# Patient Record
Sex: Female | Born: 1980 | Race: White | Hispanic: No | Marital: Married | Smoking: Never smoker
Health system: Southern US, Community
[De-identification: ages and names within clinical notes are randomized; demographics above are authoritative.]

## PROBLEM LIST (undated history)

## (undated) DIAGNOSIS — O139 Gestational [pregnancy-induced] hypertension without significant proteinuria, unspecified trimester: Secondary | ICD-10-CM

## (undated) DIAGNOSIS — Z789 Other specified health status: Secondary | ICD-10-CM

## (undated) HISTORY — DX: Gestational (pregnancy-induced) hypertension without significant proteinuria, unspecified trimester: O13.9

## (undated) HISTORY — DX: Other specified health status: Z78.9

## (undated) HISTORY — PX: WISDOM TOOTH EXTRACTION: SHX21

---

## 2001-01-07 ENCOUNTER — Other Ambulatory Visit: Admission: RE | Admit: 2001-01-07 | Discharge: 2001-01-07 | Payer: Self-pay | Admitting: Family Medicine

## 2003-10-22 ENCOUNTER — Encounter: Admission: RE | Admit: 2003-10-22 | Discharge: 2003-10-22 | Payer: Self-pay | Admitting: Family Medicine

## 2003-10-22 IMAGING — CT CT ABDOMEN W/O CM
1 series · 15 of 32 positions shown, 19 images · IV contrast (agent unspecified)
Comparison: none

CLINICAL DATA: Hematuria and renal colic.  Suspect renal calculi.
TECHNIQUE: Multidetector helical CT of the abdomen and pelvis was performed following the renal stone protocol.  No oral or intravenous contrast was administered.  There are no prior studies for comparison. 
 CT ABDOMEN W/O CONTRAST: 
 There is no evidence of renal calculi or hydronephrosis.  Both kidneys are normal in contour.  There is no evidence of perinephric fluid or inflammatory change.  The visualized portions of the other abdominal parenchymal organs are unremarkable on this non contrast study.  No masses are identified and there is no evidence of inflammatory process or abnormal fluid collections.

[Series 2: renal stone · axial · 0.57mm/px · z∈[-405,-80]mm · 15 of 72 slices shown, 19 images]
[im 5/72  soft-tissue]
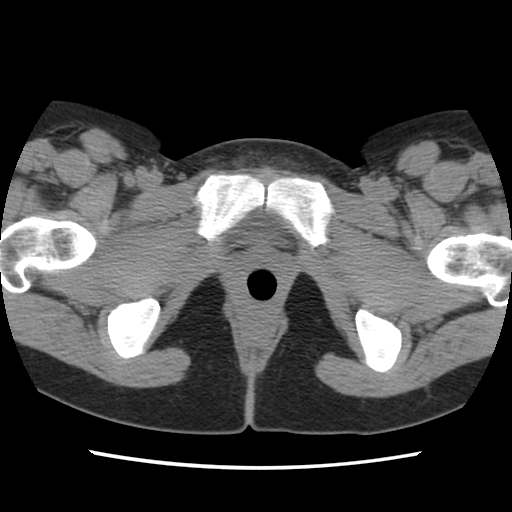
[im 5/72  bone]
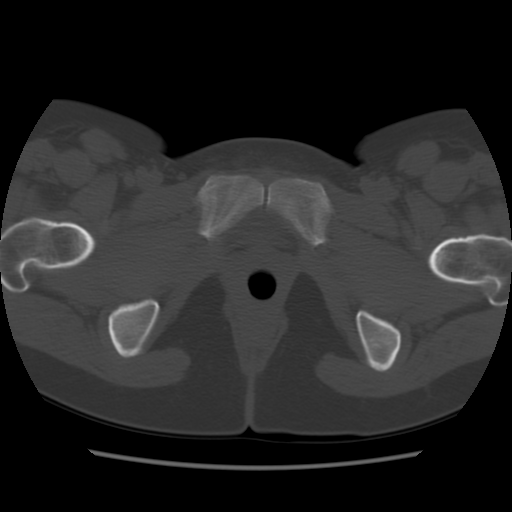
[im 10/72  soft-tissue]
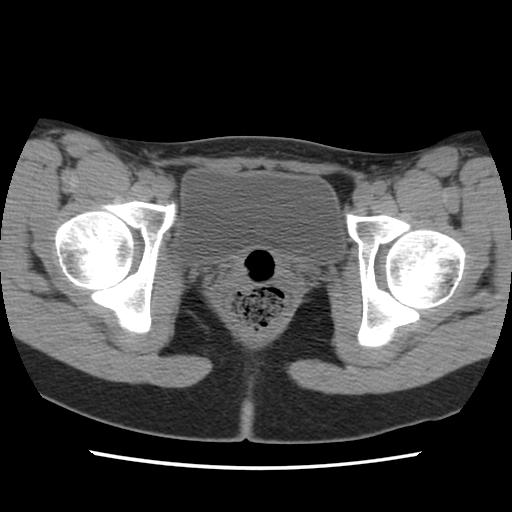
[im 14/72  soft-tissue]
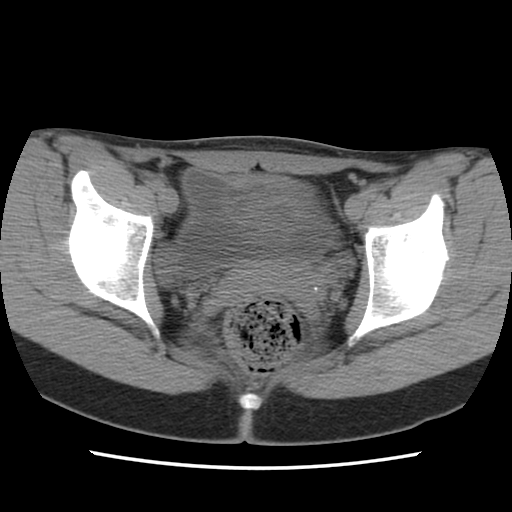
[im 21/72  soft-tissue]
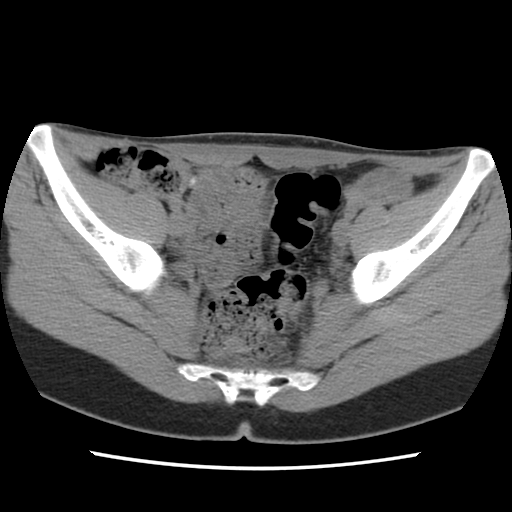
[im 26/72  soft-tissue]
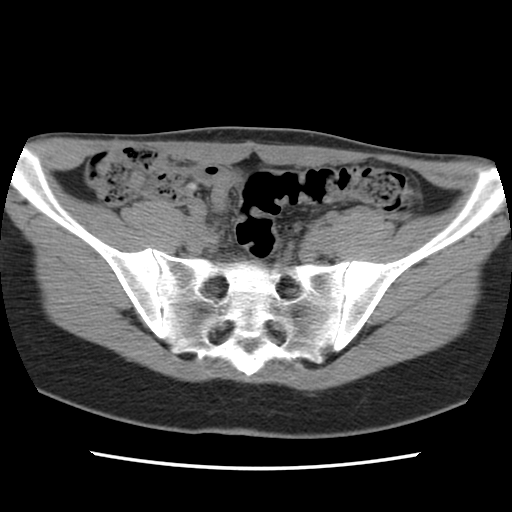
[im 30/72  soft-tissue]
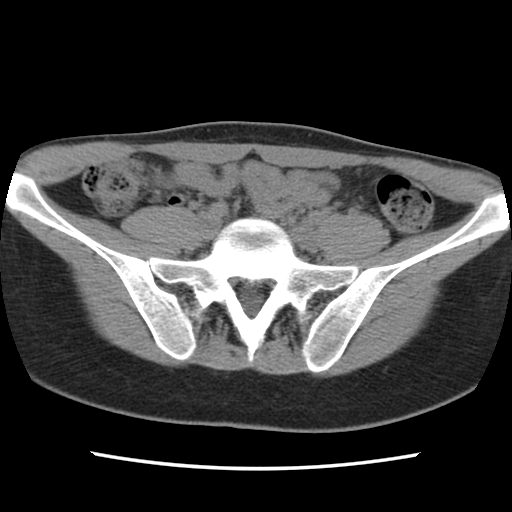
[im 37/72  soft-tissue]
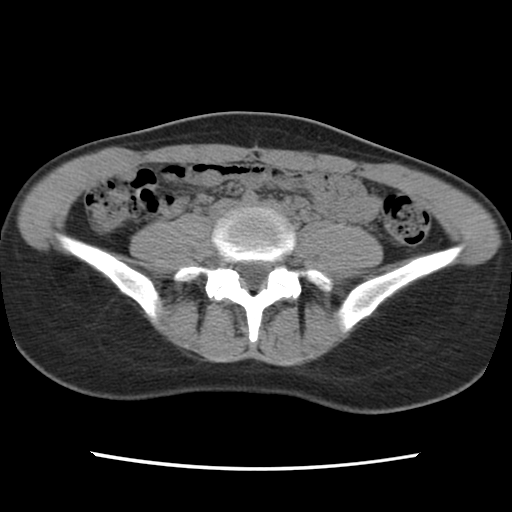
[im 42/72  soft-tissue]
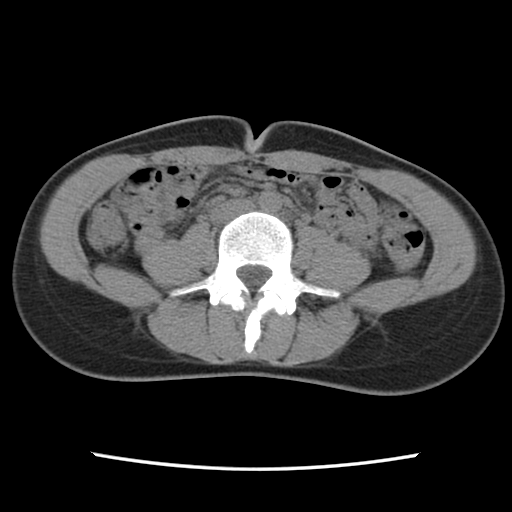
[im 46/72  soft-tissue]
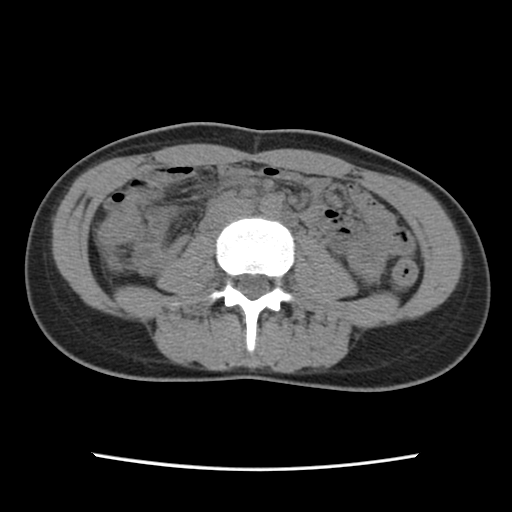
[im 46/72  bone]
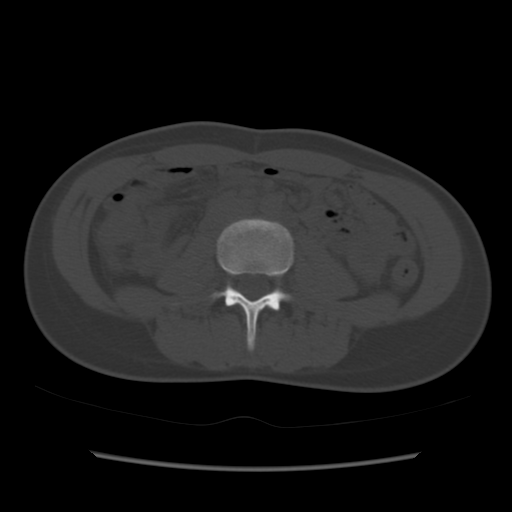
[im 51/72  soft-tissue]
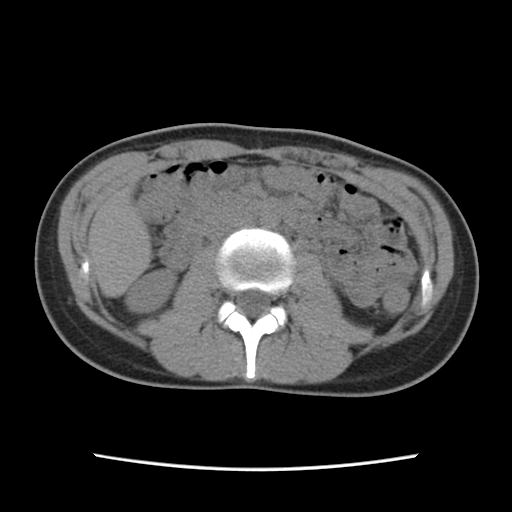
[im 58/72  soft-tissue]
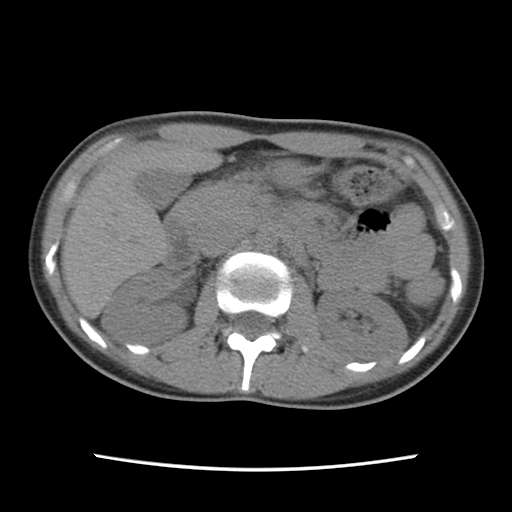
[im 62/72  soft-tissue]
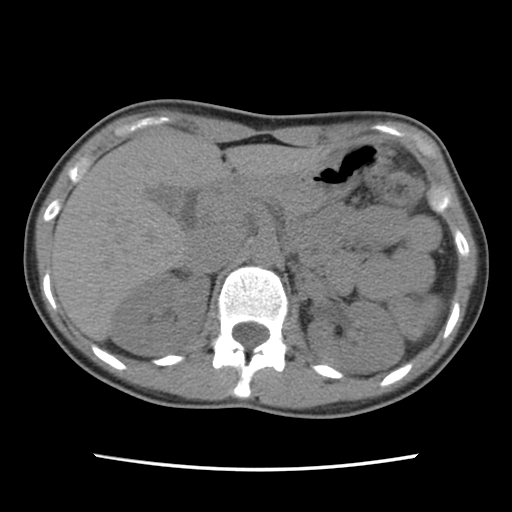
[im 62/72  lung]
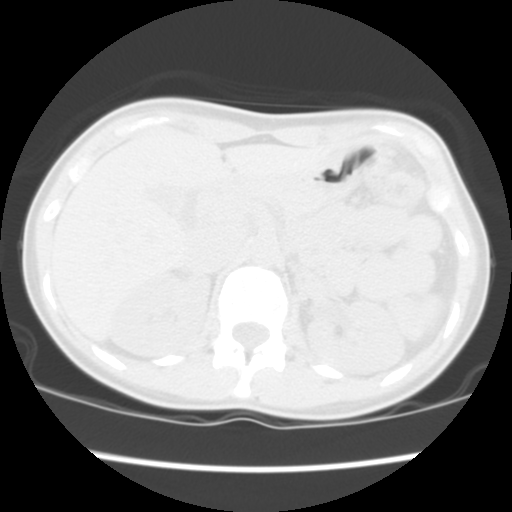
[im 65/72  lung]
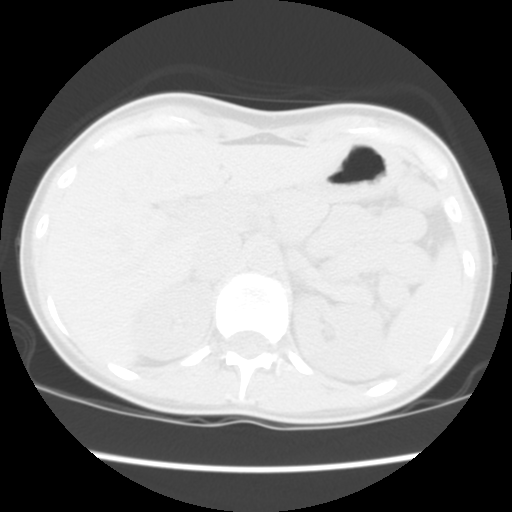
[im 67/72  soft-tissue]
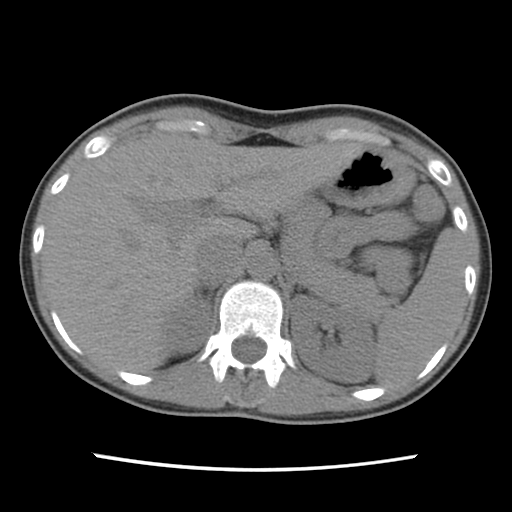
[im 67/72  lung]
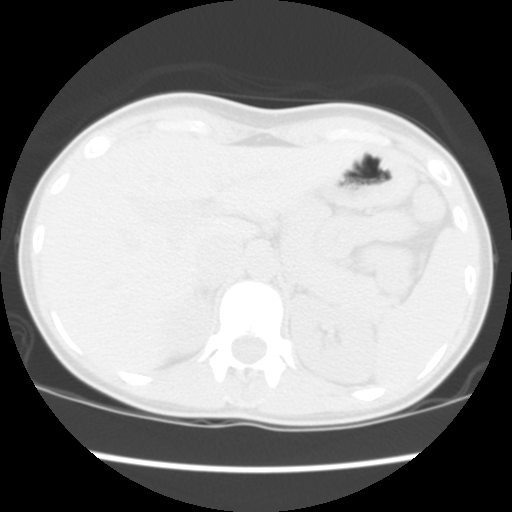
[im 69/72  lung]
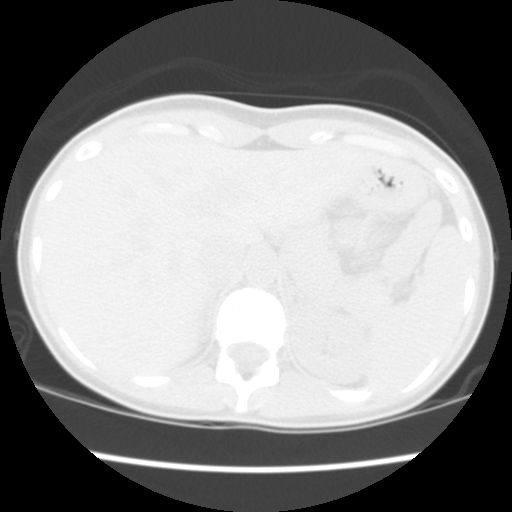

[15 of 32 positions shown; findings below may reference images not displayed]

IMPRESSION: Negative non contrast abdomen CT.  No evidence of renal calculi or hydronephrosis.  
 CT PELVIS W/O CONTRAST: 
 There is no evidence of ureteral calculi or dilatation.  No calculi are seen within the urinary bladder.  A left pelvic phlebolith is noted.  
 No abnormal soft tissue masses are identified.  There is no evidence of inflammatory process or abnormal fluid collections.  The unopacified bowel loops are unremarkable in appearance.
IMPRESSION: No evidence of ureteral calculi or other acute pelvic process.

## 2007-05-30 ENCOUNTER — Other Ambulatory Visit: Admission: RE | Admit: 2007-05-30 | Discharge: 2007-05-30 | Payer: Self-pay | Admitting: Family Medicine

## 2007-06-09 ENCOUNTER — Encounter: Admission: RE | Admit: 2007-06-09 | Discharge: 2007-06-09 | Payer: Self-pay | Admitting: Family Medicine

## 2007-06-09 IMAGING — US US BREAST R
1 series · 4 of 4 positions shown · non-contrast
Comparison: none

RIGHT BREAST ULTRASOUND
Technologist: MENSEL, Medical

RIGHT BREAST ULTRASOUND:
CLINICAL DATA: Doctor feels palpable mass, 12 o'clock position.

[Series 1: us breast right · 4 of 4 slices shown]
[im 1/4]
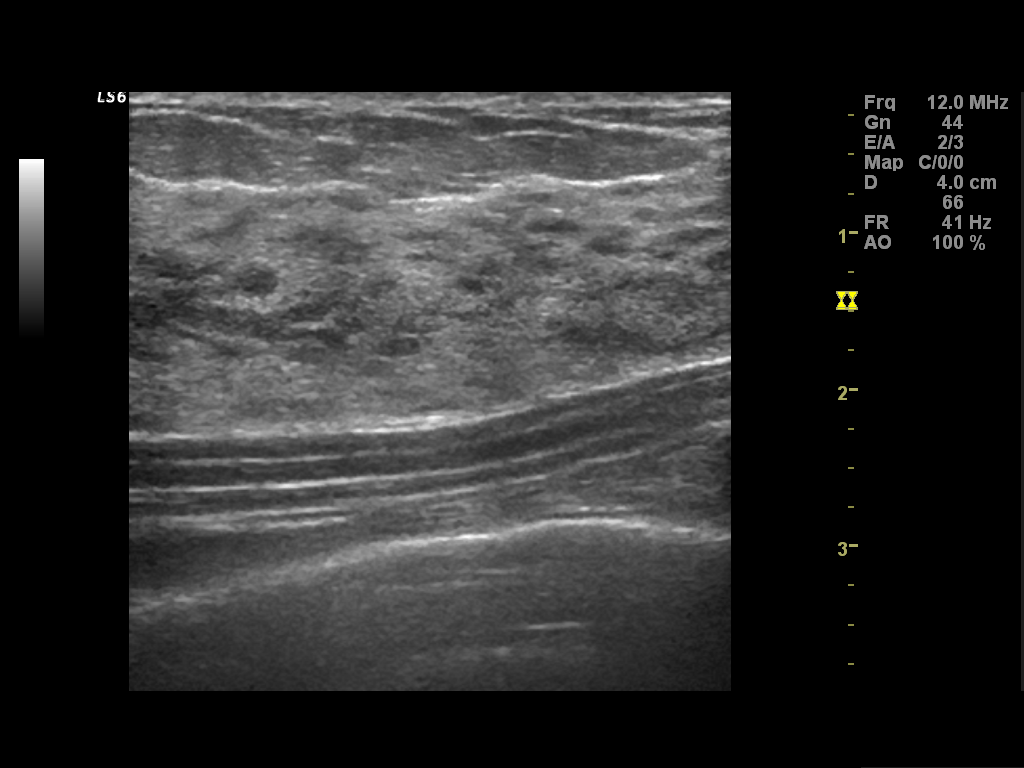
[im 2/4]
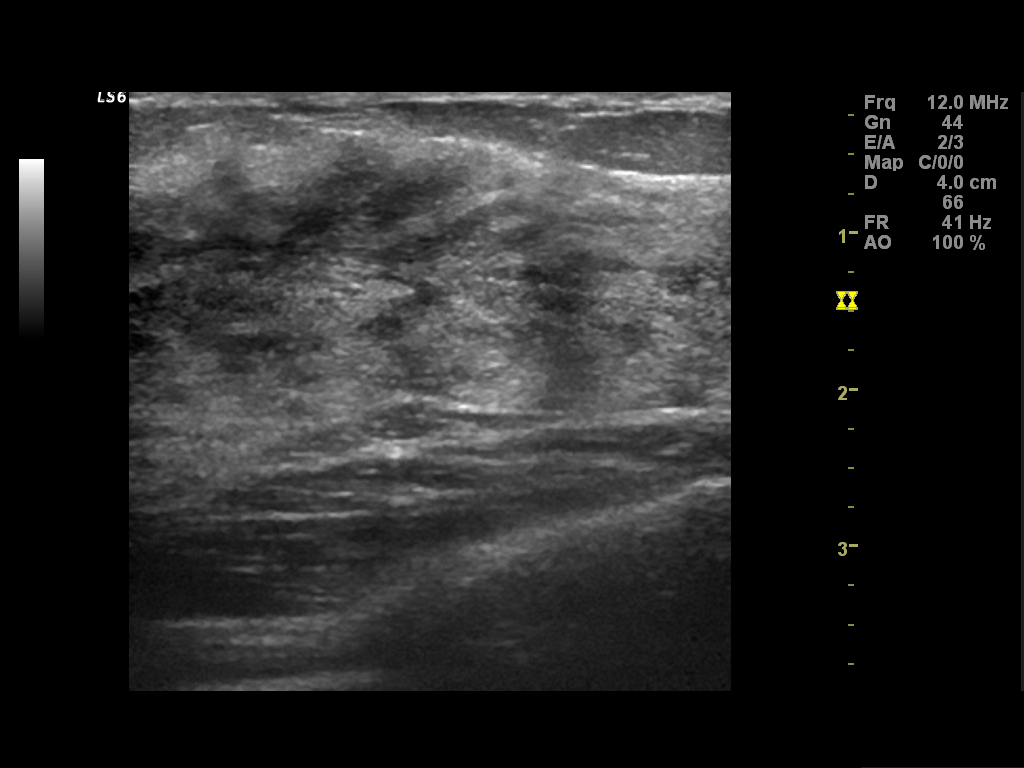
[im 3/4]
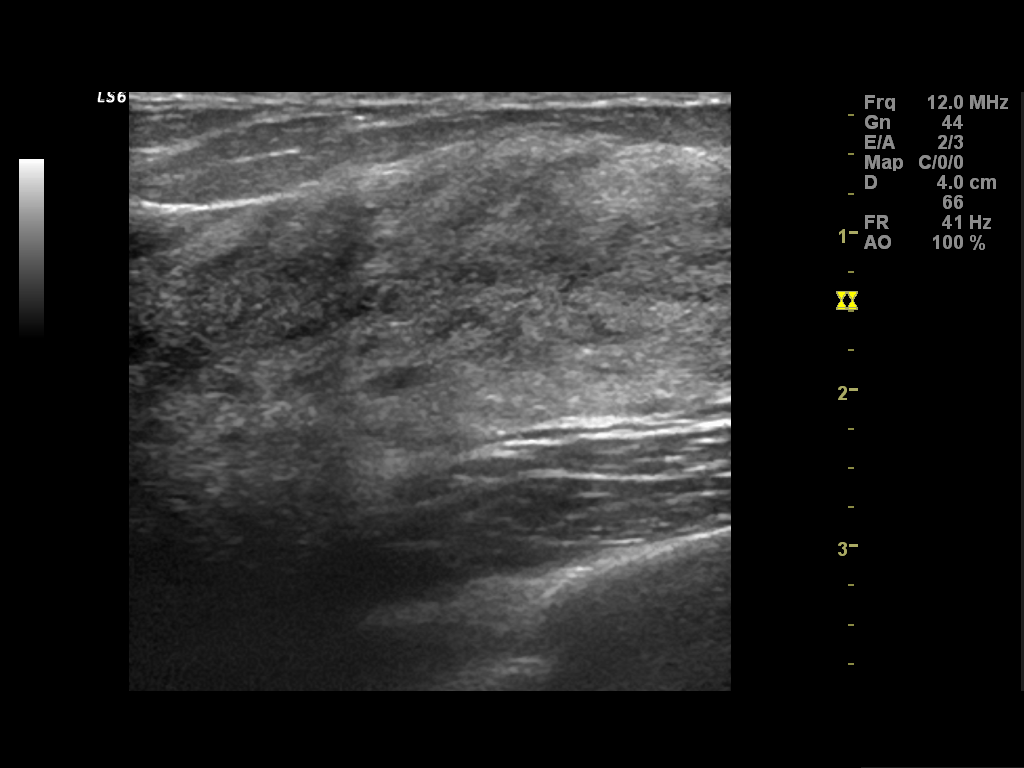
[im 4/4]
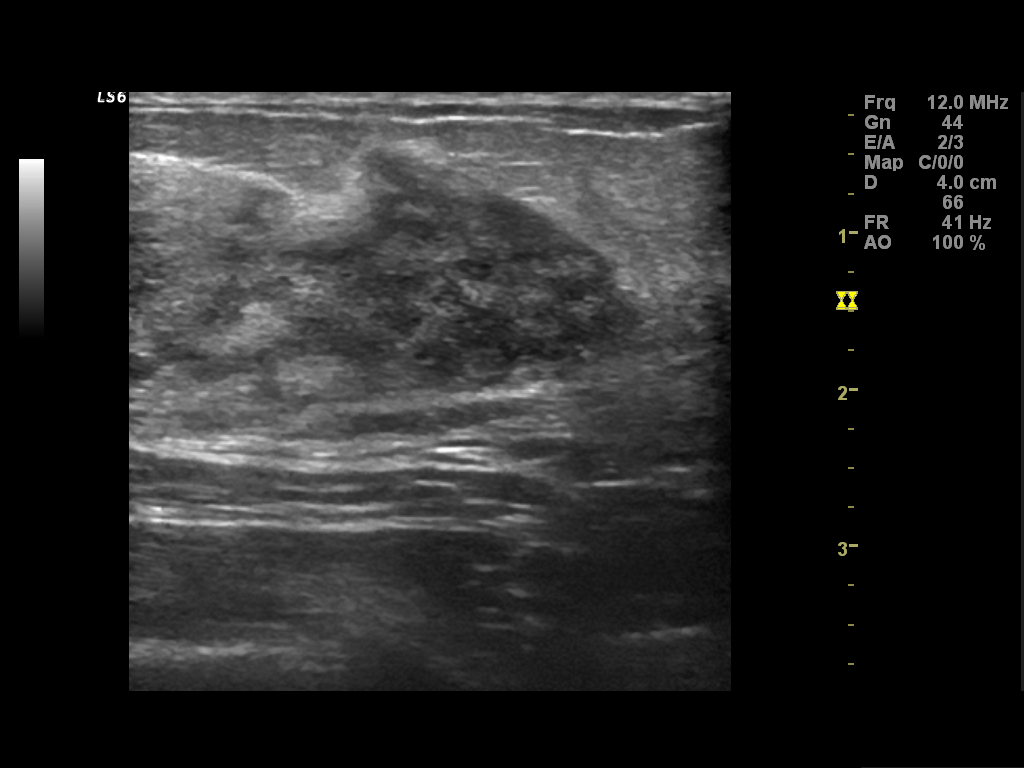

[4 of 4 positions shown; findings below may reference images not displayed]

On physical examination, there is dense fibroglandular tissue in the 12 o'clock position of both 
breasts.  Ultrasound of the right 11-1 o'clock position reveals dense fibroglandular tissue 
accounting for the area of palpable concern.

Comparison scanning of the contralateral breast shows similar features.
IMPRESSION: Negative.

ASSESSMENT: Negative - BI-RADS 1

Screening mammogram of both breasts at age 39.
,

## 2008-06-18 ENCOUNTER — Other Ambulatory Visit: Admission: RE | Admit: 2008-06-18 | Discharge: 2008-06-18 | Payer: Self-pay | Admitting: Family Medicine

## 2010-05-18 ENCOUNTER — Encounter
Admission: RE | Admit: 2010-05-18 | Discharge: 2010-05-18 | Payer: Self-pay | Source: Home / Self Care | Admitting: Obstetrics and Gynecology

## 2010-05-18 IMAGING — MG MM DIGITAL DIAGNOSTIC BILAT
5 series · 5 of 5 positions shown · non-contrast
Comparison: None

CLINICAL DATA: Palpable abnormality in the upper inner quadrant of
the right breast.  The patient has 2 grandmothers and 1 aunt
diagnosed with breast cancer.

DIGITAL DIAGNOSTIC BILATERAL MAMMOGRAM CAD AND RIGHT BREAST
ULTRASOUND:

[R CC]
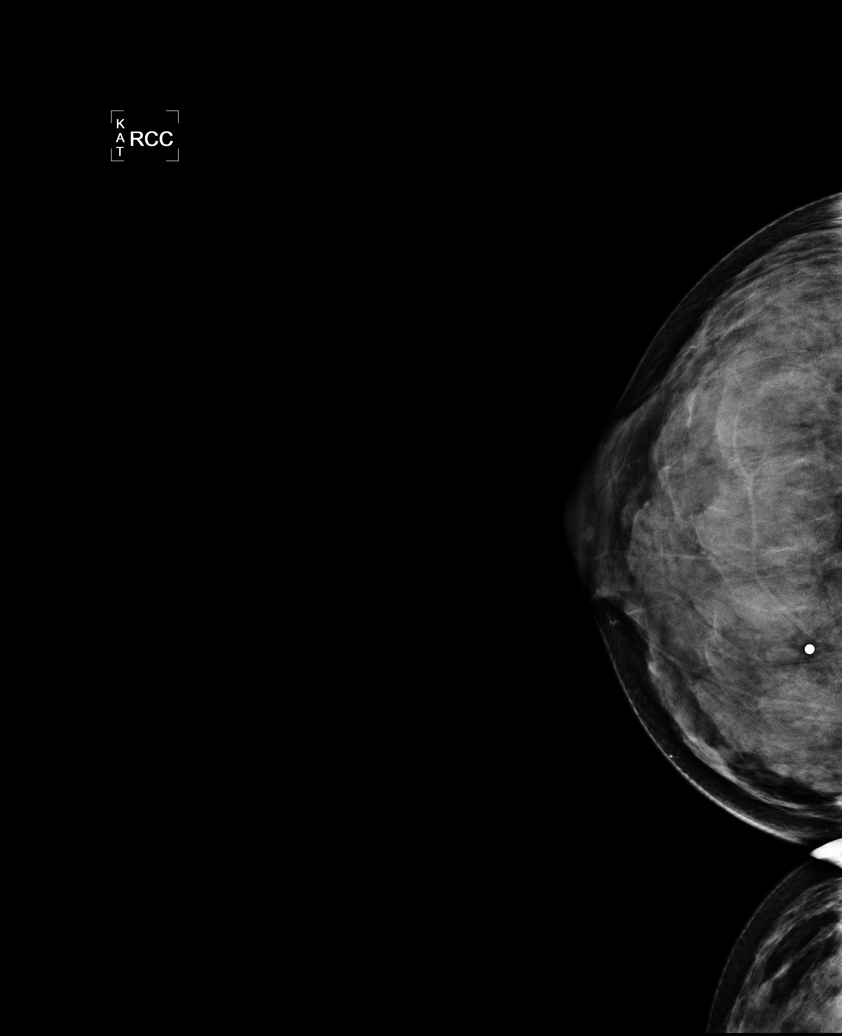

[L CC]
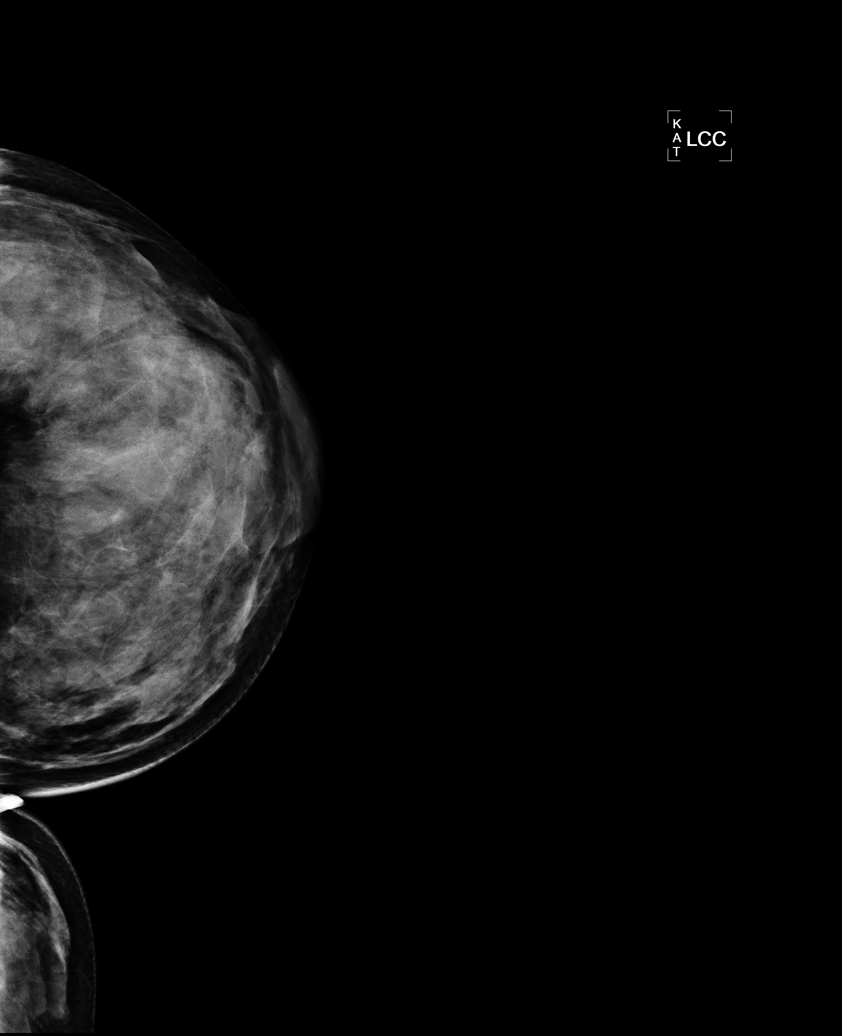

[L MLO]
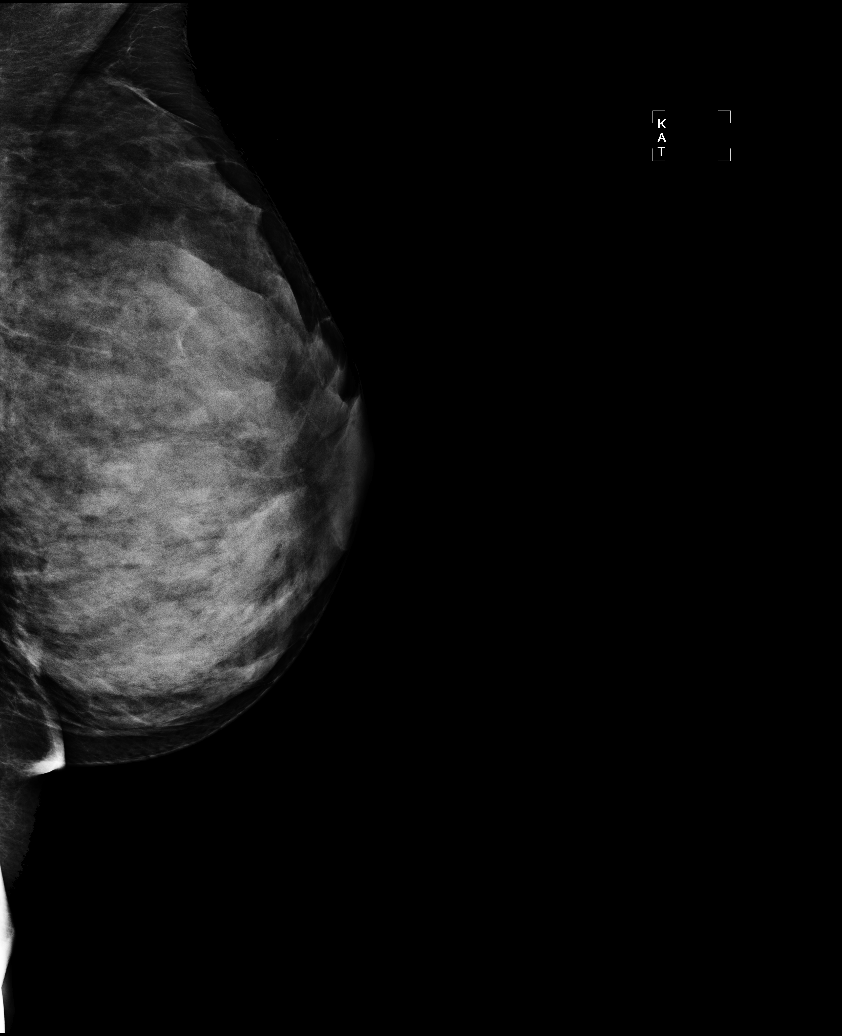

[R MLO]
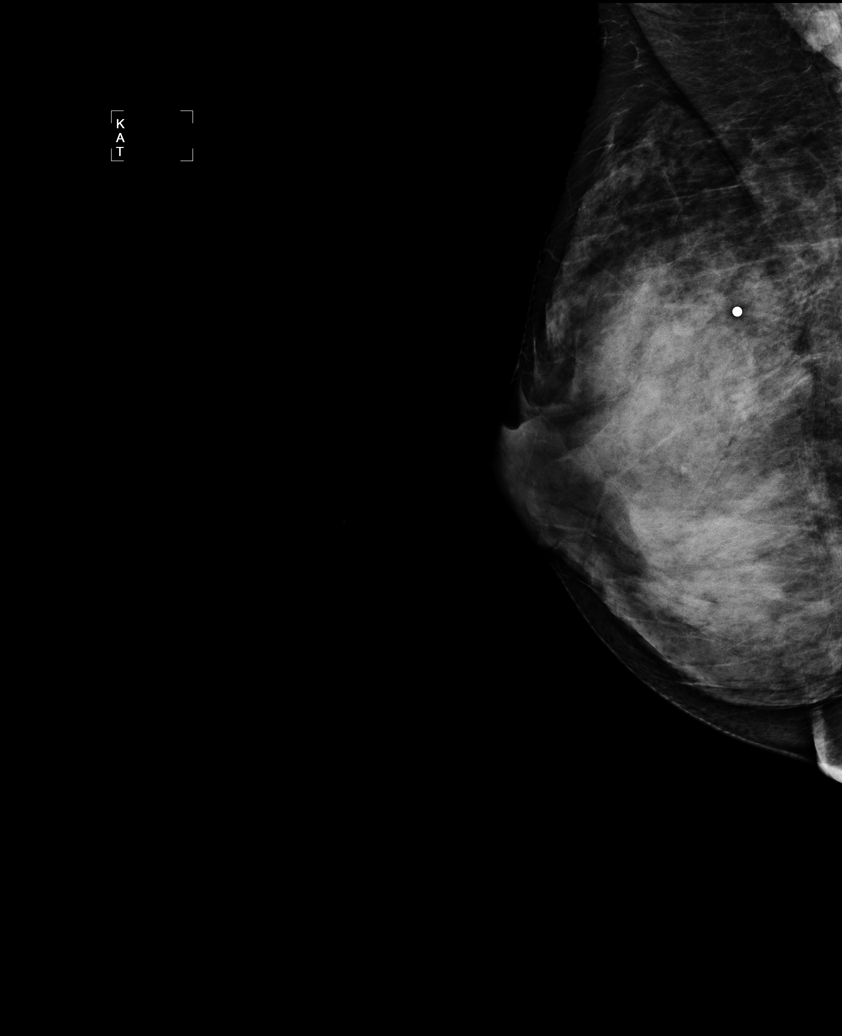

[R TAN]
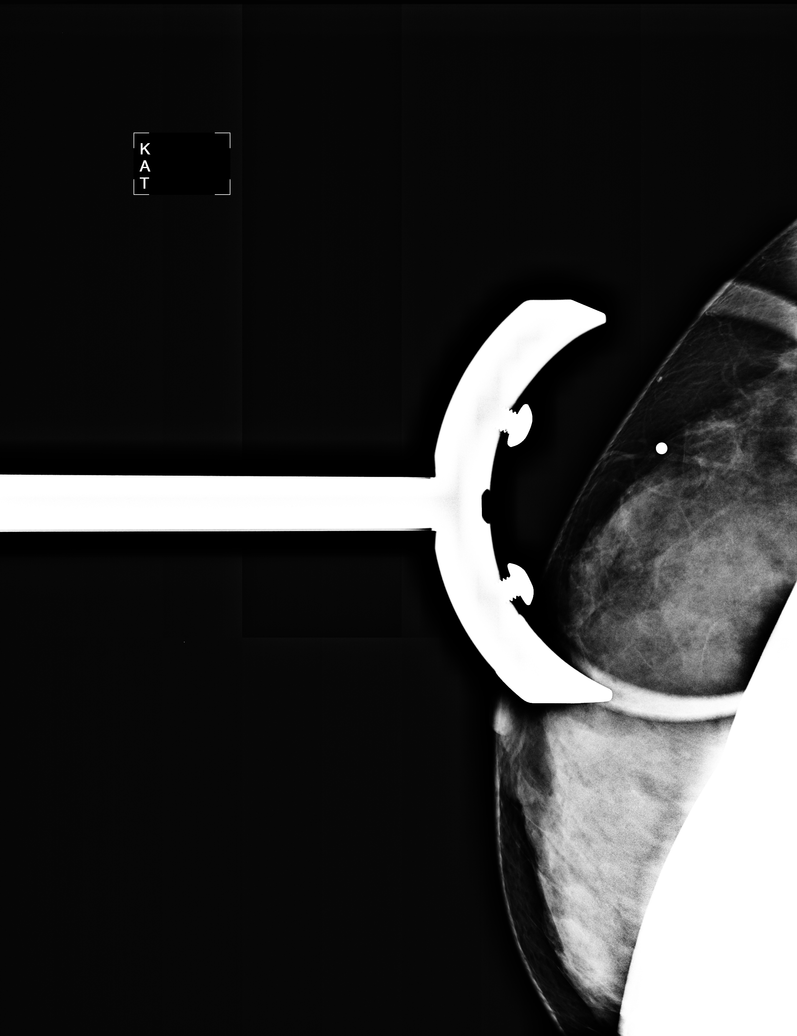

[5 of 5 positions shown; findings below may reference images not displayed]

FINDINGS: Breast parenchyma is extremely dense. No suspicious
mass, distortion, or microcalcifications are identified to suggest
presence of malignancy.  Spot tangential view of the area of
concern to the patient the right shows a normal-appearing,
extremely dense breast parenchyma without mass, distortion, or
suspicious microcalcifications.
Mammographic images were processed with CAD.

On physical exam, I palpate soft thickening in the 1 o'clock
location of the right breast.

Ultrasound is performed, showing normal-appearing fibroglandular
tissue within the upper-outer quadrant of the right breast. No
mass, distortion, or acoustic shadowing is demonstrated with
ultrasound.
IMPRESSION: No mammographic or ultrasound evidence for malignancy.  Annual
screening mammography is recommended to begin at age 39.

BI-RADS CATEGORY 1:  Negative.

## 2018-11-21 LAB — OB RESULTS CONSOLE GC/CHLAMYDIA
Chlamydia: NEGATIVE
Gonorrhea: NEGATIVE

## 2018-11-21 LAB — OB RESULTS CONSOLE ABO/RH: RH Type: POSITIVE

## 2018-11-21 LAB — OB RESULTS CONSOLE RUBELLA ANTIBODY, IGM: Rubella: NON-IMMUNE/NOT IMMUNE

## 2018-11-21 LAB — OB RESULTS CONSOLE ANTIBODY SCREEN: Antibody Screen: NEGATIVE

## 2018-11-21 LAB — OB RESULTS CONSOLE HEPATITIS B SURFACE ANTIGEN: Hepatitis B Surface Ag: NEGATIVE

## 2018-11-21 LAB — OB RESULTS CONSOLE HIV ANTIBODY (ROUTINE TESTING): HIV: NONREACTIVE

## 2018-11-21 LAB — OB RESULTS CONSOLE RPR: RPR: NONREACTIVE

## 2019-05-29 LAB — OB RESULTS CONSOLE GBS: GBS: NEGATIVE

## 2019-06-10 ENCOUNTER — Telehealth (HOSPITAL_COMMUNITY): Payer: Self-pay | Admitting: *Deleted

## 2019-06-10 ENCOUNTER — Encounter (HOSPITAL_COMMUNITY): Payer: Self-pay | Admitting: *Deleted

## 2019-06-10 NOTE — Telephone Encounter (Signed)
Preadmission screen  

## 2019-06-16 ENCOUNTER — Other Ambulatory Visit (HOSPITAL_COMMUNITY)
Admission: RE | Admit: 2019-06-16 | Discharge: 2019-06-16 | Disposition: A | Discharge status: Home / Self Care | Payer: 59 | Source: Ambulatory Visit | Attending: Obstetrics and Gynecology | Admitting: Obstetrics and Gynecology

## 2019-06-16 LAB — SARS CORONAVIRUS 2 (TAT 6-24 HRS): SARS Coronavirus 2: NEGATIVE

## 2019-06-17 ENCOUNTER — Inpatient Hospital Stay (HOSPITAL_COMMUNITY)
Admission: AD | Admit: 2019-06-17 | Discharge: 2019-06-19 | DRG: 788 | Disposition: A | Discharge status: Home / Self Care | Payer: 59 | Attending: Obstetrics and Gynecology | Admitting: Obstetrics and Gynecology

## 2019-06-17 ENCOUNTER — Other Ambulatory Visit: Payer: Self-pay

## 2019-06-17 ENCOUNTER — Encounter (HOSPITAL_COMMUNITY)
Admission: AD | Disposition: A | Discharge status: Home / Self Care | Payer: Self-pay | Source: Home / Self Care | Attending: Obstetrics and Gynecology

## 2019-06-17 ENCOUNTER — Inpatient Hospital Stay (HOSPITAL_COMMUNITY)
Admission: AD | Admit: 2019-06-17 | Discharge: 2019-06-17 | Payer: 59 | Source: Home / Self Care | Attending: Obstetrics and Gynecology | Admitting: Obstetrics and Gynecology

## 2019-06-17 ENCOUNTER — Inpatient Hospital Stay (HOSPITAL_COMMUNITY): Payer: 59 | Admitting: Anesthesiology

## 2019-06-17 ENCOUNTER — Encounter (HOSPITAL_COMMUNITY): Payer: Self-pay | Admitting: Obstetrics and Gynecology

## 2019-06-17 ENCOUNTER — Inpatient Hospital Stay (HOSPITAL_COMMUNITY): Payer: 59

## 2019-06-17 DIAGNOSIS — O134 Gestational [pregnancy-induced] hypertension without significant proteinuria, complicating childbirth: Secondary | ICD-10-CM | POA: Diagnosis present

## 2019-06-17 DIAGNOSIS — Z20822 Contact with and (suspected) exposure to covid-19: Secondary | ICD-10-CM | POA: Diagnosis present

## 2019-06-17 DIAGNOSIS — O4292 Full-term premature rupture of membranes, unspecified as to length of time between rupture and onset of labor: Secondary | ICD-10-CM | POA: Diagnosis present

## 2019-06-17 DIAGNOSIS — Z3A38 38 weeks gestation of pregnancy: Secondary | ICD-10-CM

## 2019-06-17 DIAGNOSIS — O4290 Premature rupture of membranes, unspecified as to length of time between rupture and onset of labor, unspecified weeks of gestation: Secondary | ICD-10-CM | POA: Diagnosis present

## 2019-06-17 DIAGNOSIS — Z98891 History of uterine scar from previous surgery: Secondary | ICD-10-CM

## 2019-06-17 LAB — COMPREHENSIVE METABOLIC PANEL
ALT: 22 U/L (ref 0–44)
ALT: 23 U/L (ref 0–44)
AST: 48 U/L — ABNORMAL HIGH (ref 15–41)
AST: 26 U/L (ref 15–41)
Albumin: 3.1 g/dL — ABNORMAL LOW (ref 3.5–5.0)
Albumin: 3 g/dL — ABNORMAL LOW (ref 3.5–5.0)
Alkaline Phosphatase: 128 U/L — ABNORMAL HIGH (ref 38–126)
Alkaline Phosphatase: 136 U/L — ABNORMAL HIGH (ref 38–126)
Anion gap: 9 (ref 5–15)
Anion gap: 8 (ref 5–15)
BUN: 10 mg/dL (ref 6–20)
BUN: 8 mg/dL (ref 6–20)
CO2: 20 mmol/L — ABNORMAL LOW (ref 22–32)
CO2: 24 mmol/L (ref 22–32)
Calcium: 9 mg/dL (ref 8.9–10.3)
Calcium: 8.6 mg/dL — ABNORMAL LOW (ref 8.9–10.3)
Chloride: 104 mmol/L (ref 98–111)
Chloride: 100 mmol/L (ref 98–111)
Creatinine, Ser: 0.62 mg/dL (ref 0.44–1.00)
Creatinine, Ser: 0.64 mg/dL (ref 0.44–1.00)
GFR calc Af Amer: >60 mL/min (ref >60)
GFR calc Af Amer: >60 mL/min (ref >60)
GFR calc non Af Amer: >60 mL/min (ref >60)
GFR calc non Af Amer: >60 mL/min (ref >60)
Glucose, Bld: 78 mg/dL (ref 70–99)
Glucose, Bld: 86 mg/dL (ref 70–99)
Potassium: 5 mmol/L (ref 3.5–5.1)
Potassium: 3.8 mmol/L (ref 3.5–5.1)
Sodium: 133 mmol/L — ABNORMAL LOW (ref 135–145)
Sodium: 132 mmol/L — ABNORMAL LOW (ref 135–145)
Total Bilirubin: 0.9 mg/dL (ref 0.3–1.2)
Total Bilirubin: 0.8 mg/dL (ref 0.3–1.2)
Total Protein: 5.8 g/dL — ABNORMAL LOW (ref 6.5–8.1)
Total Protein: 5.5 g/dL — ABNORMAL LOW (ref 6.5–8.1)

## 2019-06-17 LAB — CBC
HCT: 40.8 % (ref 36.0–46.0)
HCT: 41.3 % (ref 36.0–46.0)
Hemoglobin: 14.3 g/dL (ref 12.0–15.0)
Hemoglobin: 14.2 g/dL (ref 12.0–15.0)
MCH: 33.1 pg (ref 26.0–34.0)
MCH: 32.5 pg (ref 26.0–34.0)
MCHC: 35 g/dL (ref 30.0–36.0)
MCHC: 34.4 g/dL (ref 30.0–36.0)
MCV: 94.4 fL (ref 80.0–100.0)
MCV: 94.5 fL (ref 80.0–100.0)
Platelets: 160 10*3/uL (ref 150–400)
Platelets: 161 10*3/uL (ref 150–400)
RBC: 4.32 MIL/uL (ref 3.87–5.11)
RBC: 4.37 MIL/uL (ref 3.87–5.11)
RDW: 12.1 % (ref 11.5–15.5)
RDW: 12.1 % (ref 11.5–15.5)
WBC: 7.8 10*3/uL (ref 4.0–10.5)
WBC: 9 10*3/uL (ref 4.0–10.5)
nRBC: 0 % (ref 0.0–0.2)
nRBC: 0 % (ref 0.0–0.2)

## 2019-06-17 LAB — TYPE AND SCREEN
ABO/RH(D): O POS
Antibody Screen: NEGATIVE

## 2019-06-17 LAB — ABO/RH: ABO/RH(D): O POS

## 2019-06-17 IMAGING — DX DG ABD PORTABLE 1V
1 series · 1 of 1 positions shown · non-contrast
Comparison: None.

CLINICAL DATA: Sponge count.

EXAM:
PORTABLE ABDOMEN - 1 VIEW

[abdomen]
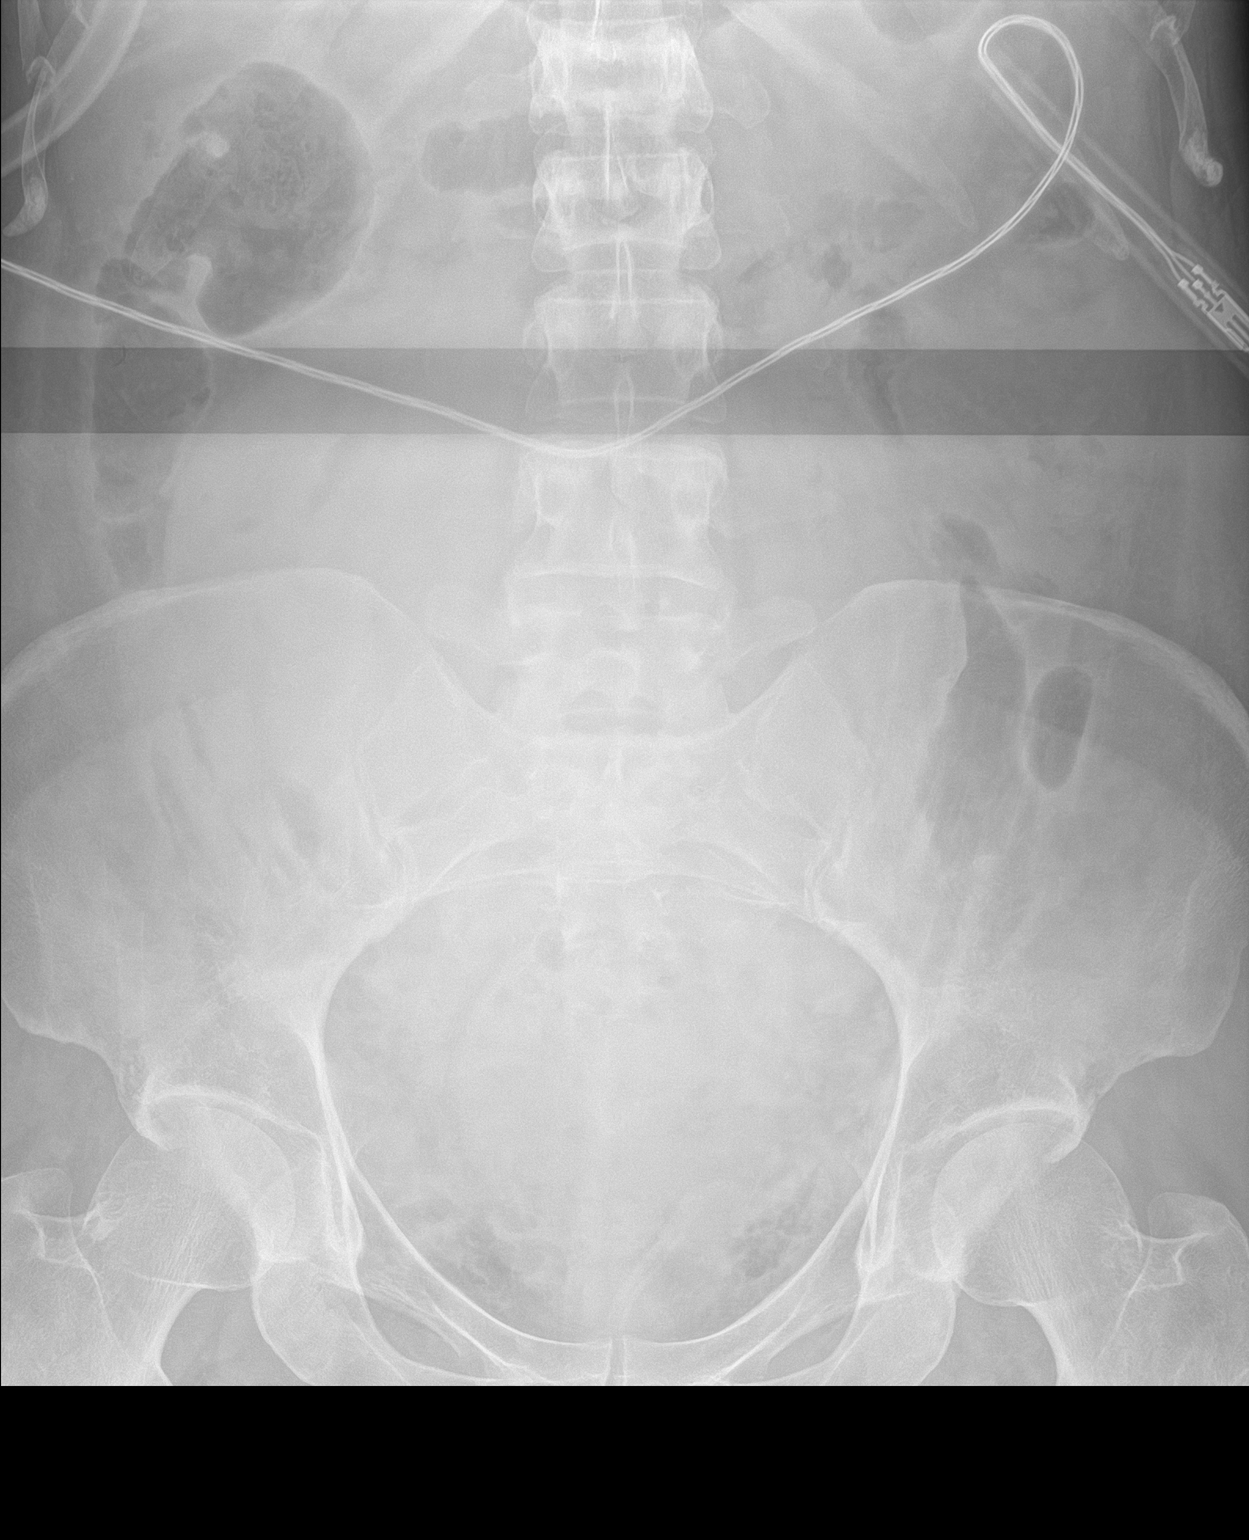

[1 of 1 positions shown; findings below may reference images not displayed]

FINDINGS: There is no unexpected radiopaque foreign body. The bowel gas
pattern is nonobstructive. Gas is noted projecting over the
patient's pelvis which is likely related to recent C-section.
IMPRESSION: No unexpected radiopaque foreign body.

These results will be called to the ordering clinician or
representative by the Radiologist Assistant, and communication
documented in the PACS or zVision Dashboard.

## 2019-06-17 SURGERY — Surgical Case
Anesthesia: General | Wound class: Clean Contaminated

## 2019-06-17 MED ORDER — ACETAMINOPHEN 325 MG PO TABS: 650.0000 mg | ORAL_TABLET | ORAL | Status: DC | PRN

## 2019-06-17 MED ORDER — LACTATED RINGERS IV SOLN
500.0000 mL | INTRAVENOUS | Status: DC | PRN
  Administered 2019-06-17: 23:00:00 500 mL via INTRAVENOUS

## 2019-06-17 MED ORDER — FENTANYL CITRATE (PF) 100 MCG/2ML IJ SOLN: 50.0000 ug | INTRAMUSCULAR | Status: DC | PRN

## 2019-06-17 MED ORDER — OXYTOCIN 40 UNITS IN NORMAL SALINE INFUSION - SIMPLE MED
1.0000 m[IU]/min | INTRAVENOUS | Status: DC
  Administered 2019-06-17: 17:00:00 12 m[IU]/min via INTRAVENOUS
  Administered 2019-06-17: 13:00:00 2 m[IU]/min via INTRAVENOUS
  Filled 2019-06-17: qty 1000

## 2019-06-17 MED ORDER — OXYTOCIN 40 UNITS IN NORMAL SALINE INFUSION - SIMPLE MED: 2.5000 [IU]/h | INTRAVENOUS | Status: DC

## 2019-06-17 MED ORDER — LIDOCAINE HCL (PF) 1 % IJ SOLN: 30.0000 mL | INTRAMUSCULAR | Status: DC | PRN

## 2019-06-17 MED ORDER — SOD CITRATE-CITRIC ACID 500-334 MG/5ML PO SOLN
30.0000 mL | ORAL | Status: DC | PRN
  Administered 2019-06-17: 22:00:00 30 mL via ORAL
  Filled 2019-06-17: qty 30

## 2019-06-17 MED ORDER — MAGNESIUM SULFATE 40 GM/1000ML IV SOLN
2.0000 g/h | INTRAVENOUS | Status: DC
  Filled 2019-06-17: qty 1000

## 2019-06-17 MED ORDER — ONDANSETRON HCL 4 MG/2ML IJ SOLN
INTRAMUSCULAR | Status: AC
  Filled 2019-06-17: qty 2

## 2019-06-17 MED ORDER — PROPOFOL 10 MG/ML IV BOLUS
INTRAVENOUS | Status: DC | PRN
  Administered 2019-06-17: 150 mg via INTRAVENOUS

## 2019-06-17 MED ORDER — LACTATED RINGERS IV SOLN: INTRAVENOUS | Status: DC

## 2019-06-17 MED ORDER — CEFAZOLIN SODIUM-DEXTROSE 2-4 GM/100ML-% IV SOLN
INTRAVENOUS | Status: AC
  Filled 2019-06-17: qty 100

## 2019-06-17 MED ORDER — FENTANYL CITRATE (PF) 250 MCG/5ML IJ SOLN
INTRAMUSCULAR | Status: AC
  Filled 2019-06-17: qty 10

## 2019-06-17 MED ORDER — LABETALOL HCL 100 MG PO TABS
100.0000 mg | ORAL_TABLET | Freq: Two times a day (BID) | ORAL | Status: DC
  Administered 2019-06-17 – 2019-06-19 (×4): 100 mg via ORAL
  Filled 2019-06-17 (×4): qty 1

## 2019-06-17 MED ORDER — PROPOFOL 10 MG/ML IV BOLUS
INTRAVENOUS | Status: AC
  Filled 2019-06-17: qty 20

## 2019-06-17 MED ORDER — ONDANSETRON HCL 4 MG/2ML IJ SOLN
4.0000 mg | Freq: Four times a day (QID) | INTRAMUSCULAR | Status: DC | PRN
  Administered 2019-06-17: 4 mg via INTRAVENOUS

## 2019-06-17 MED ORDER — MIDAZOLAM HCL 5 MG/5ML IJ SOLN
INTRAMUSCULAR | Status: DC | PRN
  Administered 2019-06-17: 2 mg via INTRAVENOUS

## 2019-06-17 MED ORDER — CEFAZOLIN SODIUM-DEXTROSE 2-3 GM-%(50ML) IV SOLR
INTRAVENOUS | Status: DC | PRN
  Administered 2019-06-17: 2 g via INTRAVENOUS

## 2019-06-17 MED ORDER — MIDAZOLAM HCL 2 MG/2ML IJ SOLN
INTRAMUSCULAR | Status: AC
  Filled 2019-06-17: qty 2

## 2019-06-17 MED ORDER — TERBUTALINE SULFATE 1 MG/ML IJ SOLN
0.2500 mg | Freq: Once | INTRAMUSCULAR | Status: AC | PRN
  Administered 2019-06-17: 23:00:00 0.25 mg via SUBCUTANEOUS
  Filled 2019-06-17: qty 1

## 2019-06-17 MED ORDER — OXYCODONE-ACETAMINOPHEN 5-325 MG PO TABS: 2.0000 | ORAL_TABLET | ORAL | Status: DC | PRN

## 2019-06-17 MED ORDER — ROCURONIUM BROMIDE 10 MG/ML (PF) SYRINGE
PREFILLED_SYRINGE | INTRAVENOUS | Status: AC
  Filled 2019-06-17: qty 10

## 2019-06-17 MED ORDER — OXYTOCIN BOLUS FROM INFUSION: 500.0000 mL | Freq: Once | INTRAVENOUS | Status: DC

## 2019-06-17 MED ORDER — OXYCODONE-ACETAMINOPHEN 5-325 MG PO TABS: 1.0000 | ORAL_TABLET | ORAL | Status: DC | PRN

## 2019-06-17 MED ORDER — FENTANYL CITRATE (PF) 250 MCG/5ML IJ SOLN
INTRAMUSCULAR | Status: DC | PRN
  Administered 2019-06-17: 100 ug via INTRAVENOUS
  Administered 2019-06-17: 250 ug via INTRAVENOUS
  Administered 2019-06-17: 150 ug via INTRAVENOUS

## 2019-06-17 MED ORDER — ROCURONIUM BROMIDE 100 MG/10ML IV SOLN
INTRAVENOUS | Status: DC | PRN
  Administered 2019-06-17: 50 mg via INTRAVENOUS

## 2019-06-17 MED ORDER — OXYTOCIN 40 UNITS IN NORMAL SALINE INFUSION - SIMPLE MED
INTRAVENOUS | Status: AC
  Filled 2019-06-17: qty 1000

## 2019-06-17 MED ORDER — SUCCINYLCHOLINE CHLORIDE 200 MG/10ML IV SOSY: PREFILLED_SYRINGE | INTRAVENOUS | Status: DC | PRN

## 2019-06-17 MED ORDER — SODIUM CHLORIDE 0.9 % IV SOLN: INTRAVENOUS | Status: DC | PRN

## 2019-06-17 MED ORDER — MAGNESIUM SULFATE BOLUS VIA INFUSION
4.0000 g | Freq: Once | INTRAVENOUS | Status: AC
  Administered 2019-06-17: 14:00:00 4 g via INTRAVENOUS
  Filled 2019-06-17: qty 1000

## 2019-06-17 MED ORDER — LACTATED RINGERS IV SOLN: INTRAVENOUS | Status: DC | PRN

## 2019-06-17 MED ORDER — OXYTOCIN 40 UNITS IN NORMAL SALINE INFUSION - SIMPLE MED
INTRAVENOUS | Status: DC | PRN
  Administered 2019-06-17: 40 mL via INTRAVENOUS

## 2019-06-17 MED ORDER — FLEET ENEMA 7-19 GM/118ML RE ENEM: 1.0000 | ENEMA | RECTAL | Status: DC | PRN

## 2019-06-17 SURGICAL SUPPLY — 38 items
ADH SKN CLS APL DERMABOND .7 (GAUZE/BANDAGES/DRESSINGS)
APL SKNCLS STERI-STRIP NONHPOA (GAUZE/BANDAGES/DRESSINGS) ×1
BENZOIN TINCTURE PRP APPL 2/3 (GAUZE/BANDAGES/DRESSINGS) ×2 IMPLANT
CHLORAPREP W/TINT 26ML (MISCELLANEOUS) ×3 IMPLANT
CLAMP CORD UMBIL (MISCELLANEOUS) IMPLANT
CLOSURE WOUND 1/2 X4 (GAUZE/BANDAGES/DRESSINGS) ×1
CLOTH BEACON ORANGE TIMEOUT ST (SAFETY) ×3 IMPLANT
DERMABOND ADVANCED (GAUZE/BANDAGES/DRESSINGS)
DERMABOND ADVANCED .7 DNX12 (GAUZE/BANDAGES/DRESSINGS) IMPLANT
DRSG OPSITE POSTOP 4X10 (GAUZE/BANDAGES/DRESSINGS) ×3 IMPLANT
ELECT REM PT RETURN 9FT ADLT (ELECTROSURGICAL) ×3
ELECTRODE REM PT RTRN 9FT ADLT (ELECTROSURGICAL) ×1 IMPLANT
EXTRACTOR VACUUM M CUP 4 TUBE (SUCTIONS) IMPLANT
EXTRACTOR VACUUM M CUP 4' TUBE (SUCTIONS)
GLOVE BIO SURGEON STRL SZ7.5 (GLOVE) ×3 IMPLANT
GLOVE BIOGEL PI IND STRL 7.0 (GLOVE) ×1 IMPLANT
GLOVE BIOGEL PI INDICATOR 7.0 (GLOVE) ×2
GOWN STRL REUS W/TWL LRG LVL3 (GOWN DISPOSABLE) ×6 IMPLANT
KIT ABG SYR 3ML LUER SLIP (SYRINGE) ×3 IMPLANT
NDL HYPO 25X5/8 SAFETYGLIDE (NEEDLE) ×1 IMPLANT
NEEDLE HYPO 25X5/8 SAFETYGLIDE (NEEDLE) ×3 IMPLANT
NS IRRIG 1000ML POUR BTL (IV SOLUTION) ×3 IMPLANT
PACK C SECTION WH (CUSTOM PROCEDURE TRAY) ×3 IMPLANT
PAD ABD 8X10 STRL (GAUZE/BANDAGES/DRESSINGS) ×2 IMPLANT
PAD OB MATERNITY 4.3X12.25 (PERSONAL CARE ITEMS) ×3 IMPLANT
PENCIL SMOKE EVAC W/HOLSTER (ELECTROSURGICAL) ×3 IMPLANT
STRIP CLOSURE SKIN 1/2X4 (GAUZE/BANDAGES/DRESSINGS) ×1 IMPLANT
SUT MNCRL 0 VIOLET CTX 36 (SUTURE) ×4 IMPLANT
SUT MONOCRYL 0 CTX 36 (SUTURE) ×8
SUT PDS AB 0 CTX 60 (SUTURE) ×3 IMPLANT
SUT PLAIN 0 NONE (SUTURE) IMPLANT
SUT PLAIN 2 0 (SUTURE)
SUT PLAIN 2 0 XLH (SUTURE) IMPLANT
SUT PLAIN ABS 2-0 CT1 27XMFL (SUTURE) IMPLANT
SUT VIC AB 4-0 KS 27 (SUTURE) ×3 IMPLANT
TOWEL OR 17X24 6PK STRL BLUE (TOWEL DISPOSABLE) ×3 IMPLANT
TRAY FOLEY W/BAG SLVR 14FR LF (SET/KITS/TRAYS/PACK) ×3 IMPLANT
WATER STERILE IRR 1000ML POUR (IV SOLUTION) ×3 IMPLANT

## 2019-06-17 NOTE — Progress Notes (Signed)
FHT cat one UCs q2-3 min Cx 2/90/-2 AROM of forebag>clear

## 2019-06-17 NOTE — Anesthesia Procedure Notes (Addendum)
Procedure Name: Intubation Date/Time: 06/17/2019 10:50 PM Performed by: Renford Dills, CRNA Pre-anesthesia Checklist: Patient identified, Emergency Drugs available, Suction available, Patient being monitored and Timeout performed Patient Re-evaluated:Patient Re-evaluated prior to induction Oxygen Delivery Method: Circle system utilized Preoxygenation: Pre-oxygenation with 100% oxygen Induction Type: IV induction, Rapid sequence and Cricoid Pressure applied Laryngoscope Size: Glidescope Grade View: Grade II Tube type: Oral Tube size: 7.0 mm Number of attempts: 1 Airway Equipment and Method: Video-laryngoscopy and Stylet Placement Confirmation: ETT inserted through vocal cords under direct vision,  positive ETCO2 and breath sounds checked- equal and bilateral Secured at: 22 cm Tube secured with: Tape Dental Injury: Teeth and Oropharynx as per pre-operative assessment

## 2019-06-17 NOTE — Progress Notes (Signed)
No HA, no vision change, no epigastric pain Today's Vitals   06/17/19 1301 06/17/19 1315 06/17/19 1325 06/17/19 1330  BP: (!) 149/106 (!) 147/100  (!) 144/99  Pulse: 89 83  84  Resp: 18 18  18   Temp:      TempSrc:      PainSc:   1     There is no height or weight on file to calculate BMI.  BP 161/100>149/106  DTR 2+ Abdomen NT  Results for orders placed or performed during the hospital encounter of 06/17/19 (from the past 24 hour(s))  CBC     Status: None   Collection Time: 06/17/19 12:02 PM  Result Value Ref Range   WBC 7.8 4.0 - 10.5 K/uL   RBC 4.32 3.87 - 5.11 MIL/uL   Hemoglobin 14.3 12.0 - 15.0 g/dL   HCT 08/15/19 81.1 - 03.1 %   MCV 94.4 80.0 - 100.0 fL   MCH 33.1 26.0 - 34.0 pg   MCHC 35.0 30.0 - 36.0 g/dL   RDW 59.4 58.5 - 92.9 %   Platelets 160 150 - 400 K/uL   nRBC 0.0 0.0 - 0.2 %  Comprehensive metabolic panel     Status: Abnormal   Collection Time: 06/17/19 12:02 PM  Result Value Ref Range   Sodium 133 (L) 135 - 145 mmol/L   Potassium 5.0 3.5 - 5.1 mmol/L   Chloride 104 98 - 111 mmol/L   CO2 20 (L) 22 - 32 mmol/L   Glucose, Bld 78 70 - 99 mg/dL   BUN 10 6 - 20 mg/dL   Creatinine, Ser 08/15/19 0.44 - 1.00 mg/dL   Calcium 9.0 8.9 - 6.28 mg/dL   Total Protein 5.8 (L) 6.5 - 8.1 g/dL   Albumin 3.1 (L) 3.5 - 5.0 g/dL   AST 48 (H) 15 - 41 U/L   ALT 22 0 - 44 U/L   Alkaline Phosphatase 128 (H) 38 - 126 U/L   Total Bilirubin 0.9 0.3 - 1.2 mg/dL   GFR calc non Af Amer >60 >60 mL/min   GFR calc Af Amer >60 >60 mL/min   Anion gap 9 5 - 15  Type and screen [facility]     Status: None (Preliminary result)   Collection Time: 06/17/19 12:12 PM  Result Value Ref Range   ABO/RH(D) PENDING    Antibody Screen PENDING    Sample Expiration      06/20/2019,2359 Performed at [facility], [address], [place], [place]    FHT reactive UCs q2-4 min  A/P: Preeclampsia with sever features         Begin magnesium sulfate infusion  Labs this afternoon         D/W patient

## 2019-06-17 NOTE — Progress Notes (Signed)
FHT cat one UCs q2-4 min Cx 1/90/-3 Unable to pass IUPC

## 2019-06-17 NOTE — Anesthesia Preprocedure Evaluation (Signed)
Anesthesia Evaluation  Patient identified by MRN, date of birth, ID band Patient awake    Reviewed: Allergy & Precautions, NPO status , Patient's Chart, lab work & pertinent test results  History of Anesthesia Complications Negative for: history of anesthetic complications  Airway Mallampati: II  TM Distance: >3 FB Neck ROM: Full    Dental no notable dental hx.    Pulmonary neg pulmonary ROS,    Pulmonary exam normal        Cardiovascular hypertension, negative cardio ROS Normal cardiovascular exam     Neuro/Psych negative neurological ROS  negative psych ROS   GI/Hepatic negative GI ROS, Neg liver ROS,   Endo/Other  negative endocrine ROS  Renal/GU negative Renal ROS  negative genitourinary   Musculoskeletal negative musculoskeletal ROS (+)   Abdominal   Peds  Hematology negative hematology ROS (+)   Anesthesia Other Findings Day of surgery medications reviewed with patient.  Reproductive/Obstetrics (+) Pregnancy (PreE on Mg)                             Anesthesia Physical Anesthesia Plan  ASA: III and emergent  Anesthesia Plan: General   Post-op Pain Management:    Induction: Intravenous  PONV Risk Score and Plan: 4 or greater and Treatment may vary due to age or medical condition, Ondansetron and Dexamethasone  Airway Management Planned: Oral ETT  Additional Equipment: None  Intra-op Plan:   Post-operative Plan: Extubation in OR  Informed Consent: I have reviewed the patients History and Physical, chart, labs and discussed the procedure including the risks, benefits and alternatives for the proposed anesthesia with the patient or authorized representative who has indicated his/her understanding and acceptance.     Dental advisory given  Plan Discussed with: CRNA  Anesthesia Plan Comments:         Anesthesia Quick Evaluation

## 2019-06-17 NOTE — Brief Op Note (Signed)
06/17/2019  11:52 PM  PATIENT:  Laura Logan  39 y.o. female  PRE-OPERATIVE DIAGNOSIS:  Fetal distress  POST-OPERATIVE DIAGNOSIS:  Fetal distress  PROCEDURE:  Procedure(s): CESAREAN SECTION (N/A)  SURGEON:  Surgeon(s) and Role:    * Harold Hedge, MD - Primary  PHYSICIAN ASSISTANT:   ASSISTANTS: none   ANESTHESIA:   general  EBL:  Per anesthesia note   BLOOD ADMINISTERED:none  DRAINS: Urinary Catheter (Foley)   LOCAL MEDICATIONS USED:  NONE  SPECIMEN:  Source of Specimen:  placenta  DISPOSITION OF SPECIMEN:  PATHOLOGY  COUNTS:  ACTION TAKEN: X-RAY(S) TAKEN post op xray clear  TOURNIQUET:  * No tourniquets in log *  DICTATION: .Other Dictation: Dictation Number [id number]  PLAN OF CARE: Admit to inpatient   PATIENT DISPOSITION:  PACU - hemodynamically stable.   Delay start of Pharmacological VTE agent (>24hrs) due to surgical blood loss or risk of bleeding: not applicable

## 2019-06-17 NOTE — H&P (Signed)
Laura Logan is a 39 y.o. female presenting for PROM about 8pm yesterday, clear, no fever. Pregnancy complicated by hypertension-taking labetalol 100 mg BID, and AMA with normal Panorama, 46 XY. OB History    Gravida  1   Para      Term      Preterm      AB      Living        SAB      TAB      Ectopic      Multiple      Live Births             Past Medical History:  Diagnosis Date  . Medical history non-contributory   . Pregnancy induced hypertension    Past Surgical History:  Procedure Laterality Date  . WISDOM TOOTH EXTRACTION     Family History: family history includes Cancer in her father, maternal grandfather, maternal grandmother, paternal grandfather, and paternal grandmother; Heart disease in her father, paternal grandfather, and paternal grandmother; Hypertension in her father and mother. Social History:  reports that she has never smoked. She has never used smokeless tobacco. She reports previous alcohol use. She reports that she does not use drugs.     Maternal Diabetes: No Genetic Screening: Normal Maternal Ultrasounds/Referrals: Normal Fetal Ultrasounds or other Referrals:  None Maternal Substance Abuse:  No Significant Maternal Medications:  Meds include: Other: Labetalol Significant Maternal Lab Results:  Group B Strep negative Other Comments:  None  Review of Systems  Eyes: Negative for visual disturbance.  Gastrointestinal: Negative for abdominal pain.  Neurological: Negative for headaches.   History   Last menstrual period 09/19/2018. Exam Physical Exam  Prenatal labs: ABO, Rh: O/Positive/-- (06/12 0000) Antibody: Negative (06/12 0000) Rubella: Nonimmune (06/12 0000) RPR: Nonreactive (06/12 0000)  HBsAg: Negative (06/12 0000)  HIV: Non-reactive (06/12 0000)  GBS: Negative/-- (12/18 0000)   Assessment/Plan: 39 yo G1P0 @ 38 5/7 weeks  PROM GBBS negative HTN on labetalol   Laura Logan II 06/17/2019, 12:07  PM

## 2019-06-18 ENCOUNTER — Inpatient Hospital Stay (HOSPITAL_COMMUNITY): Payer: 59

## 2019-06-18 ENCOUNTER — Encounter (HOSPITAL_COMMUNITY): Payer: Self-pay | Admitting: Obstetrics and Gynecology

## 2019-06-18 LAB — COMPREHENSIVE METABOLIC PANEL
ALT: 18 U/L (ref 0–44)
AST: 30 U/L (ref 15–41)
Albumin: 2.4 g/dL — ABNORMAL LOW (ref 3.5–5.0)
Alkaline Phosphatase: 107 U/L (ref 38–126)
Anion gap: 9 (ref 5–15)
BUN: 8 mg/dL (ref 6–20)
CO2: 24 mmol/L (ref 22–32)
Calcium: 7 mg/dL — ABNORMAL LOW (ref 8.9–10.3)
Chloride: 98 mmol/L (ref 98–111)
Creatinine, Ser: 0.64 mg/dL (ref 0.44–1.00)
GFR calc Af Amer: >60 mL/min (ref >60)
GFR calc non Af Amer: >60 mL/min (ref >60)
Glucose, Bld: 115 mg/dL — ABNORMAL HIGH (ref 70–99)
Potassium: 4.5 mmol/L (ref 3.5–5.1)
Sodium: 131 mmol/L — ABNORMAL LOW (ref 135–145)
Total Bilirubin: 0.7 mg/dL (ref 0.3–1.2)
Total Protein: 5 g/dL — ABNORMAL LOW (ref 6.5–8.1)

## 2019-06-18 LAB — CBC
HCT: 35.6 % — ABNORMAL LOW (ref 36.0–46.0)
Hemoglobin: 12.3 g/dL (ref 12.0–15.0)
MCH: 32.6 pg (ref 26.0–34.0)
MCHC: 34.6 g/dL (ref 30.0–36.0)
MCV: 94.4 fL (ref 80.0–100.0)
Platelets: 139 10*3/uL — ABNORMAL LOW (ref 150–400)
RBC: 3.77 MIL/uL — ABNORMAL LOW (ref 3.87–5.11)
RDW: 12.1 % (ref 11.5–15.5)
WBC: 13.6 10*3/uL — ABNORMAL HIGH (ref 4.0–10.5)
nRBC: 0 % (ref 0.0–0.2)

## 2019-06-18 MED ORDER — ACETAMINOPHEN 10 MG/ML IV SOLN: 1000.0000 mg | Freq: Once | INTRAVENOUS | Status: DC

## 2019-06-18 MED ORDER — SIMETHICONE 80 MG PO CHEW
80.0000 mg | CHEWABLE_TABLET | ORAL | Status: DC
  Administered 2019-06-19: 80 mg via ORAL
  Filled 2019-06-18: qty 1

## 2019-06-18 MED ORDER — DIPHENHYDRAMINE HCL 25 MG PO CAPS: 25.0000 mg | ORAL_CAPSULE | Freq: Four times a day (QID) | ORAL | Status: DC | PRN

## 2019-06-18 MED ORDER — ZOLPIDEM TARTRATE 5 MG PO TABS: 5.0000 mg | ORAL_TABLET | Freq: Every evening | ORAL | Status: DC | PRN

## 2019-06-18 MED ORDER — OXYTOCIN 40 UNITS IN NORMAL SALINE INFUSION - SIMPLE MED: 2.5000 [IU]/h | INTRAVENOUS | Status: AC

## 2019-06-18 MED ORDER — MAGNESIUM SULFATE 40 GM/1000ML IV SOLN: 2.0000 g/h | INTRAVENOUS | Status: DC

## 2019-06-18 MED ORDER — DIBUCAINE (PERIANAL) 1 % EX OINT: 1.0000 "application " | TOPICAL_OINTMENT | CUTANEOUS | Status: DC | PRN

## 2019-06-18 MED ORDER — MAGNESIUM SULFATE 40 GM/1000ML IV SOLN
2.0000 g/h | INTRAVENOUS | Status: AC
  Administered 2019-06-18: 09:00:00 2 g/h via INTRAVENOUS
  Filled 2019-06-18: qty 1000

## 2019-06-18 MED ORDER — PROMETHAZINE HCL 25 MG/ML IJ SOLN: 6.2500 mg | INTRAMUSCULAR | Status: DC | PRN

## 2019-06-18 MED ORDER — KETOROLAC TROMETHAMINE 30 MG/ML IJ SOLN
30.0000 mg | Freq: Once | INTRAMUSCULAR | Status: AC
  Administered 2019-06-18: 01:00:00 30 mg via INTRAVENOUS

## 2019-06-18 MED ORDER — HYDROMORPHONE HCL 1 MG/ML IJ SOLN
INTRAMUSCULAR | Status: AC
  Filled 2019-06-18: qty 0.5

## 2019-06-18 MED ORDER — WITCH HAZEL-GLYCERIN EX PADS: 1.0000 "application " | MEDICATED_PAD | CUTANEOUS | Status: DC | PRN

## 2019-06-18 MED ORDER — SIMETHICONE 80 MG PO CHEW
80.0000 mg | CHEWABLE_TABLET | Freq: Three times a day (TID) | ORAL | Status: DC
  Administered 2019-06-18 – 2019-06-19 (×4): 80 mg via ORAL
  Filled 2019-06-18 (×4): qty 1

## 2019-06-18 MED ORDER — IBUPROFEN 600 MG PO TABS
600.0000 mg | ORAL_TABLET | Freq: Four times a day (QID) | ORAL | Status: DC
  Administered 2019-06-18 – 2019-06-19 (×5): 600 mg via ORAL
  Filled 2019-06-18 (×5): qty 1

## 2019-06-18 MED ORDER — SENNOSIDES-DOCUSATE SODIUM 8.6-50 MG PO TABS
2.0000 | ORAL_TABLET | ORAL | Status: DC
  Administered 2019-06-19: 2 via ORAL
  Filled 2019-06-18: qty 2

## 2019-06-18 MED ORDER — KETOROLAC TROMETHAMINE 30 MG/ML IJ SOLN
INTRAMUSCULAR | Status: AC
  Filled 2019-06-18: qty 1

## 2019-06-18 MED ORDER — MENTHOL 3 MG MT LOZG: 1.0000 | LOZENGE | OROMUCOSAL | Status: DC | PRN

## 2019-06-18 MED ORDER — HYDROMORPHONE HCL 1 MG/ML IJ SOLN: 0.2000 mg | INTRAMUSCULAR | Status: DC | PRN

## 2019-06-18 MED ORDER — TETANUS-DIPHTH-ACELL PERTUSSIS 5-2.5-18.5 LF-MCG/0.5 IM SUSP: 0.5000 mL | Freq: Once | INTRAMUSCULAR | Status: DC

## 2019-06-18 MED ORDER — LACTATED RINGERS IV BOLUS
500.0000 mL | Freq: Once | INTRAVENOUS | Status: AC
  Administered 2019-06-18: 500 mL via INTRAVENOUS

## 2019-06-18 MED ORDER — PRENATAL MULTIVITAMIN CH
1.0000 | ORAL_TABLET | Freq: Every day | ORAL | Status: DC
  Administered 2019-06-18: 12:00:00 1 via ORAL
  Filled 2019-06-18: qty 1

## 2019-06-18 MED ORDER — SUGAMMADEX SODIUM 500 MG/5ML IV SOLN
INTRAVENOUS | Status: AC
  Filled 2019-06-18: qty 5

## 2019-06-18 MED ORDER — OXYCODONE HCL 5 MG/5ML PO SOLN: 5.0000 mg | Freq: Once | ORAL | Status: DC | PRN

## 2019-06-18 MED ORDER — LACTATED RINGERS IV SOLN: INTRAVENOUS | Status: DC

## 2019-06-18 MED ORDER — ACETAMINOPHEN 325 MG PO TABS: 650.0000 mg | ORAL_TABLET | ORAL | Status: DC | PRN

## 2019-06-18 MED ORDER — SIMETHICONE 80 MG PO CHEW: 80.0000 mg | CHEWABLE_TABLET | ORAL | Status: DC | PRN

## 2019-06-18 MED ORDER — OXYCODONE HCL 5 MG PO TABS
5.0000 mg | ORAL_TABLET | ORAL | Status: DC | PRN
  Administered 2019-06-18: 17:00:00 5 mg via ORAL
  Filled 2019-06-18: qty 1

## 2019-06-18 MED ORDER — OXYCODONE HCL 5 MG PO TABS: 5.0000 mg | ORAL_TABLET | Freq: Once | ORAL | Status: DC | PRN

## 2019-06-18 MED ORDER — SUGAMMADEX SODIUM 500 MG/5ML IV SOLN
INTRAVENOUS | Status: DC | PRN
  Administered 2019-06-18: 200 mg via INTRAVENOUS

## 2019-06-18 MED ORDER — COCONUT OIL OIL: 1.0000 "application " | TOPICAL_OIL | Status: DC | PRN

## 2019-06-18 MED ORDER — HYDROMORPHONE HCL 1 MG/ML IJ SOLN
0.2500 mg | INTRAMUSCULAR | Status: DC | PRN
  Administered 2019-06-18 (×2): 0.5 mg via INTRAVENOUS

## 2019-06-18 NOTE — Lactation Note (Addendum)
This note was copied from a baby's chart. Lactation Consultation Note:  Nursery Staff phoned [facility] office to report that infant had a low blood sugar of 34.   Mother is a P1, C/S on Magnesium, del of a 53.5 week female. Infant less than 6 lbs at 5-11, and 8 hours old.   Staff nurse reports that infant has been sleepy most of the night and has only breastfed for a few mins. Staff nurse reports that mother wants to exclusively breastfeed and doesn't want to offer donor milk.   Discussed infants need for calories due to blood sugar levels. Informed parents of available donor milk  for supplementation if unable to obtain enough ebm . Mother agreeable to give supplement of donor milk if blood sugar remains low.   Assist mother with hand expression. Mother's colostrum flowing well. infant was spoon fed total of 5 ml ebm.   Assist mother with latching infant . Infant latched with strong tugging on and off for 5-10 mins. Observed a few swallows.    Infant placed STS with mother. Informed parents of the benefits of continued STS.  Basic breastfeeding teaching done and [facility] brochure given to mother.   Advised mother to cue base feed infant .  Encouraged mother to hand express and spoon feed infant after breastfeeding.  Staff nurse Candise Bowens to set up a DEBP for mother and assist with pumping.   Patient Name: Laura Logan [id number] Date: 06/18/2019 Reason for consult: Initial assessment   Maternal Data Has patient been taught Hand Expression?: Yes Does the patient have breastfeeding experience prior to this delivery?: No  Feeding Feeding Type: Breast Milk  LATCH Score Latch: Repeated attempts needed to sustain latch, nipple held in mouth throughout feeding, stimulation needed to elicit sucking reflex.  Audible Swallowing: A few with stimulation  Type of Nipple: Everted at rest and after stimulation  Comfort (Breast/Nipple): Soft / non-tender  Hold (Positioning): Full assist, staff holds infant at  breast  LATCH Score: 6  Interventions Interventions: Breast feeding basics reviewed;Assisted with latch;Skin to skin;Breast massage;Hand express;Adjust position;Support pillows;Position options  Lactation Tools Discussed/Used     Consult Status Consult Status: Follow-up Date: 06/19/19 Follow-up type: In-patient    Stevan Born [facility] 06/18/2019, 8:16 AM

## 2019-06-18 NOTE — Transfer of Care (Signed)
Immediate Anesthesia Transfer of Care Note  Patient: Laura Logan  Procedure(s) Performed: CESAREAN SECTION (N/A )  Patient Location: PACU  Anesthesia Type:General  Level of Consciousness: awake  Airway & Oxygen Therapy: Patient Spontanous Breathing and Patient connected to nasal cannula oxygen  Post-op Assessment: Report given to RN and Post -op Vital signs reviewed and stable  Post vital signs: stable  Last Vitals:  Vitals Value Taken Time  BP    Temp    Pulse    Resp    SpO2      Last Pain:  Vitals:   06/17/19 2200  TempSrc: Oral  PainSc: 6       Patients Stated Pain Goal: 6 (06/17/19 2000)  Complications: No apparent anesthesia complications

## 2019-06-18 NOTE — Progress Notes (Signed)
Subjective: Postpartum Day 1: Cesarean Delivery Patient reports incisional pain and tolerating PO.   No severe PIH sxs  Objective: Vital signs in last 24 hours: Temp:  [97.5 F (36.4 C)-98.2 F (36.8 C)] 97.7 F (36.5 C) (01/07 0811) Pulse Rate:  [55-89] 71 (01/07 0811) Resp:  [9-21] 12 (01/07 0954) BP: (112-161)/(72-107) 114/72 (01/07 0811) SpO2:  [92 %-99 %] 95 % (01/07 0811) Weight:  [73.9 kg] 73.9 kg (01/06 1400)  Physical Exam:  General: alert, cooperative, appears stated age and no distress Lochia: appropriate Uterine Fundus: firm Incision: healing well DVT Evaluation: No evidence of DVT seen on physical exam.  Recent Labs    06/17/19 1652 06/18/19 0526  HGB 14.2 12.3  HCT 41.3 35.6*    Assessment/Plan: Status post Cesarean section. Postoperative course complicated by Severe GHTN  Continue current care Labs normal as are BPS Will D/c Magnesium this afternoon. Pt wants Circ for their son.  Discussed with patient the R&B .  Turner Daniels 06/18/2019, 10:09 AM

## 2019-06-18 NOTE — Anesthesia Postprocedure Evaluation (Signed)
Anesthesia Post Note  Patient: Laura Logan  Procedure(s) Performed: CESAREAN SECTION (N/A )     Patient location during evaluation: PACU Anesthesia Type: General Level of consciousness: awake and alert and oriented Pain management: pain level controlled Vital Signs Assessment: post-procedure vital signs reviewed and stable Respiratory status: spontaneous breathing, nonlabored ventilation and respiratory function stable Cardiovascular status: blood pressure returned to baseline Postop Assessment: no apparent nausea or vomiting Anesthetic complications: no    Last Vitals:  Vitals:   06/18/19 0138 06/18/19 0140  BP: 121/81   Pulse: 61   Resp: 20   Temp: 36.7 C   SpO2:  95%    Last Pain:  Vitals:   06/18/19 0151  TempSrc:   PainSc: 4    Pain Goal: Patients Stated Pain Goal: 6 (06/17/19 2000)              Epidural/Spinal Function Cutaneous sensation: Able to Wiggle Toes (06/18/19 0151), Patient able to flex knees: Yes (06/18/19 0151), Patient able to lift hips off bed: Yes (06/18/19 0151), Back pain beyond tenderness at insertion site: No (06/18/19 0151), Progressively worsening motor and/or sensory loss: No (06/18/19 0151), Bowel and/or bladder incontinence post epidural: No (06/18/19 0151)  Kaylyn Layer

## 2019-06-18 NOTE — Op Note (Signed)
NAME: MAKENAH, KARAS [facility] MEDICAL RECORD [id number] ACCOUNT [id number] DATE OF BIRTH:[birth year] FACILITY: [facility] LOCATION: [facility] PHYSICIAN:Rodgerick Gilliand E. Jermya Dowding II, MD  OPERATIVE REPORT  DATE OF PROCEDURE:  06/17/2019  PREOPERATIVE DIAGNOSIS:  Fetal distress.  POSTOPERATIVE DIAGNOSIS:  Fetal distress.  PROCEDURE:  Emergent low transverse cesarean section.  SURGEON:  Harold Hedge, MD   ANESTHESIA:  General with endotracheal intubation.  ESTIMATED BLOOD LOSS:  Per anesthesia note.  FINDINGS:  Viable female infant.  Apgars, birth weight pending.  Arterial cord pH 7.17.  INDICATIONS AND CONSENT:  This patient is a 39 year old G1 P0, who presents with spontaneous rupture of membranes at approximately 8:00 p.m. last night.  Fluid is clear.  Pregnancy has been complicated by hypertension, on oral labetalol.  This was  continued in the hospital.  Blood pressures began to rise and she had a severe range blood pressure recorded.  In addition, one of the liver functions was mildly elevated.  She was therefore started on magnesium sulfate for seizure prophylaxis.  She  progressed to 2 cm dilation, 90% effacement, -2 station.  Baby had a bradycardic episode to the 60s and 70s.  I was called and immediately responded and when I arrived in the room approximately 6 minutes into the deceleration the patient was in the  knee-chest and fetal heartbeat remained below 70.  Cervix had been checked and was unchanged.  Recommendation for emergent cesarean section was made.  DESCRIPTION OF PROCEDURE:  The patient was taken to the operating room in an emergent fashion.  She was placed in the dorsal supine position.  Foley catheter was placed.  Betadine was poured over the abdomen and she was draped.  After securing  endotracheal intubation skin was entered in a Pfannenstiel incision and dissection was carried out in layers rapidly to the fascia which was taken down cephalad laterally.  Fascia was then taken down  superiorly and inferiorly, sharply and bluntly.   Peritoneum was entered sharply and extended bluntly.  Uterus was incised in a low transverse manner and the uterine cavity was entered bluntly with a hemostat.  Uterine incision was extended bilaterally with fingers.  Baby was delivered from the vertex  position.  Heart rate at that point is over 100 on palpation of the cord.  Baby gives a cry.  After about 30 seconds cord was clamped and cut and the baby was handed to waiting pediatrics team.  Placenta was manually delivered and sent to pathology.   Uterine cavity was clean.  Uterus was closed in 2 running locking imbricating layers of 0 Monocryl suture, which achieved good hemostasis.  Copious lavage was carried out.  Anterior peritoneum was closed in a running fashion with 0 Monocryl suture, which  was also used to reapproximate the pyramidalis muscle in the midline.  Anterior rectus fascia was closed in a running fashion with a 0 looped PDS.  Subcutaneous layer was closed with interrupted 2-0 plain and the skin was closed in a subcuticular  fashion with a 4-0 Vicryl on a Keith needle.  The scan for retained sponges was negative.  In addition, radiology was called and an x-ray is obtained and there was no evidence of retained instruments or sponges.  The patient was awakened and taken to  recovery room in stable condition.  CN/NUANCE  D:06/17/2019 T:06/18/2019 JOB:[id number]

## 2019-06-19 ENCOUNTER — Encounter: Payer: Self-pay | Admitting: *Deleted

## 2019-06-19 NOTE — Progress Notes (Signed)
Discharge instructions given to pt. No prescriptions were written. Discussed post-op c-section care, pre-eclampsia, signs and symptoms to report to the MD, upcoming appointments, and meds. Pt verbalizes understanding and has no questions or concerns at this time. Pt discharged home with baby in stable condition.

## 2019-06-19 NOTE — Discharge Instructions (Signed)
Preeclampsia and Eclampsia Preeclampsia is a serious condition that may develop during pregnancy. This condition causes high blood pressure and increased protein in your urine along with other symptoms, such as headaches and vision changes. These symptoms may develop as the condition gets worse. Preeclampsia may occur at 20 weeks of pregnancy or later. Diagnosing and treating preeclampsia early is very important. If not treated early, it can cause serious problems for you and your baby. One problem it can lead to is eclampsia. Eclampsia is a condition that causes muscle jerking or shaking (convulsions or seizures) and other serious problems for the mother. During pregnancy, delivering your baby may be the best treatment for preeclampsia or eclampsia. For most women, preeclampsia and eclampsia symptoms go away after giving birth. In rare cases, a woman may develop preeclampsia after giving birth (postpartum preeclampsia). This usually occurs within 48 hours after childbirth but may occur up to 6 weeks after giving birth. What are the causes? The cause of preeclampsia is not known. What increases the risk? The following risk factors make you more likely to develop preeclampsia:  Being pregnant for the first time.  Having had preeclampsia during a past pregnancy.  Having a family history of preeclampsia.  Having high blood pressure.  Being pregnant with more than one baby.  Being 35 or older.  Being African-American.  Having kidney disease or diabetes.  Having medical conditions such as lupus or blood diseases.  Being very overweight (obese). What are the signs or symptoms? The most common symptoms are:  Severe headaches.  Vision problems, such as blurred or double vision.  Abdominal pain, especially upper abdominal pain. Other symptoms that may develop as the condition gets worse include:  Sudden weight gain.  Sudden swelling of the hands, face, legs, and feet.  Severe nausea  and vomiting.  Numbness in the face, arms, legs, and feet.  Dizziness.  Urinating less than usual.  Slurred speech.  Convulsions or seizures. How is this diagnosed? There are no screening tests for preeclampsia. Your health care provider will ask you about symptoms and check for signs of preeclampsia during your prenatal visits. You may also have tests that include:  Checking your blood pressure.  Urine tests to check for protein. Your health care provider will check for this at every prenatal visit.  Blood tests.  Monitoring your baby's heart rate.  Ultrasound. How is this treated? You and your health care provider will determine the treatment approach that is best for you. Treatment may include:  Having more frequent prenatal exams to check for signs of preeclampsia, if you have an increased risk for preeclampsia.  Medicine to lower your blood pressure.  Staying in the hospital, if your condition is severe. There, treatment will focus on controlling your blood pressure and the amount of fluids in your body (fluid retention).  Taking medicine (magnesium sulfate) to prevent seizures. This may be given as an injection or through an IV.  Taking a low-dose aspirin during your pregnancy.  Delivering your baby early. You may have your labor started with medicine (induced), or you may have a cesarean delivery. Follow these instructions at home: Eating and drinking   Drink enough fluid to keep your urine pale yellow.  Avoid caffeine. Lifestyle  Do not use any products that contain nicotine or tobacco, such as cigarettes and e-cigarettes. If you need help quitting, ask your health care provider.  Do not use alcohol or drugs.  Avoid stress as much as possible. Rest and get   plenty of sleep. General instructions  Take over-the-counter and prescription medicines only as told by your health care provider.  When lying down, lie on your left side. This keeps pressure off your  major blood vessels.  When sitting or lying down, raise (elevate) your feet. Try putting some pillows underneath your lower legs.  Exercise regularly. Ask your health care provider what kinds of exercise are best for you.  Keep all follow-up and prenatal visits as told by your health care provider. This is important. How is this prevented? There is no known way of preventing preeclampsia or eclampsia from developing. However, to lower your risk of complications and detect problems early:  Get regular prenatal care. Your health care provider may be able to diagnose and treat the condition early.  Maintain a healthy weight. Ask your health care provider for help managing weight gain during pregnancy.  Work with your health care provider to manage any long-term (chronic) health conditions you have, such as diabetes or kidney problems.  You may have tests of your blood pressure and kidney function after giving birth.  Your health care provider may have you take low-dose aspirin during your next pregnancy. Contact a health care provider if:  You have symptoms that your health care provider told you may require more treatment or monitoring, such as: ? Headaches. ? Nausea or vomiting. ? Abdominal pain. ? Dizziness. ? Light-headedness. Get help right away if:  You have severe: ? Abdominal pain. ? Headaches that do not get better. ? Dizziness. ? Vision problems. ? Confusion. ? Nausea or vomiting.  You have any of the following: ? A seizure. ? Sudden, rapid weight gain. ? Sudden swelling in your hands, ankles, or face. ? Trouble moving any part of your body. ? Numbness in any part of your body. ? Trouble speaking. ? Abnormal bleeding.  You faint. Summary  Preeclampsia is a serious condition that may develop during pregnancy.  This condition causes high blood pressure and increased protein in your urine along with other symptoms, such as headaches and vision  changes.  Diagnosing and treating preeclampsia early is very important. If not treated early, it can cause serious problems for you and your baby.  Get help right away if you have symptoms that your health care provider told you to watch for. This information is not intended to replace advice given to you by your health care provider. Make sure you discuss any questions you have with your health care provider. Document Revised: 01/28/2018 Document Reviewed: 01/02/2016 Elsevier Patient Education  2020 [employer] Cesarean Delivery, Care After This sheet gives you information about how to care for yourself after your procedure. Your health care provider may also give you more specific instructions. If you have problems or questions, contact your health care provider. What can I expect after the procedure? After the procedure, it is common to have:  A small amount of blood or clear fluid coming from the incision.  Some redness, swelling, and pain in your incision area.  Some abdominal pain and soreness.  Vaginal bleeding (lochia). Even though you did not have a vaginal delivery, you will still have vaginal bleeding and discharge.  Pelvic cramps.  Fatigue. You may have pain, swelling, and discomfort in the tissue between your vagina and your anus (perineum) if:  Your C-section was unplanned, and you were allowed to labor and push.  An incision was made in the area (episiotomy) or the tissue tore during attempted vaginal delivery. Follow these  instructions at home: Incision care   Follow instructions from your health care provider about how to take care of your incision. Make sure you: ? Wash your hands with soap and water before you change your bandage (dressing). If soap and water are not available, use hand sanitizer. ? If you have a dressing, change it or remove it as told by your health care provider. ? Leave stitches (sutures), skin staples, skin glue, or adhesive strips in  place. These skin closures may need to stay in place for 2 weeks or longer. If adhesive strip edges start to loosen and curl up, you may trim the loose edges. Do not remove adhesive strips completely unless your health care provider tells you to do that.  Check your incision area every day for signs of infection. Check for: ? More redness, swelling, or pain. ? More fluid or blood. ? Warmth. ? Pus or a bad smell.  Do not take baths, swim, or use a hot tub until your health care provider says it's okay. Ask your health care provider if you can take showers.  When you cough or sneeze, hug a pillow. This helps with pain and decreases the chance of your incision opening up (dehiscing). Do this until your incision heals. Medicines  Take over-the-counter and prescription medicines only as told by your health care provider.  If you were prescribed an antibiotic medicine, take it as told by your health care provider. Do not stop taking the antibiotic even if you start to feel better.  Do not drive or use heavy machinery while taking prescription pain medicine. Lifestyle  Do not drink alcohol. This is especially important if you are breastfeeding or taking pain medicine.  Do not use any products that contain nicotine or tobacco, such as cigarettes, e-cigarettes, and chewing tobacco. If you need help quitting, ask your health care provider. Eating and drinking  Drink at least 8 eight-ounce glasses of water every day unless told not to by your health care provider. If you breastfeed, you may need to drink even more water.  Eat high-fiber foods every day. These foods may help prevent or relieve constipation. High-fiber foods include: ? Whole grain cereals and breads. ? Brown rice. ? Beans. ? Fresh fruits and vegetables. Activity   If possible, have someone help you care for your baby and help with household activities for at least a few days after you leave the hospital.  Return to your  normal activities as told by your health care provider. Ask your health care provider what activities are safe for you.  Rest as much as possible. Try to rest or take a nap while your baby is sleeping.  Do not lift anything that is heavier than 10 lbs (4.5 kg), or the limit that you were told, until your health care provider says that it is safe.  Talk with your health care provider about when you can engage in sexual activity. This may depend on your: ? Risk of infection. ? How fast you heal. ? Comfort and desire to engage in sexual activity. General instructions  Do not use tampons or douches until your health care provider approves.  Wear loose, comfortable clothing and a supportive and well-fitting bra.  Keep your perineum clean and dry. Wipe from front to back when you use the toilet.  If you pass a blood clot, save it and call your health care provider to discuss. Do not flush blood clots down the toilet before you get instructions   from your health care provider.  Keep all follow-up visits for you and your baby as told by your health care provider. This is important. Contact a health care provider if:  You have: ? A fever. ? Bad-smelling vaginal discharge. ? Pus or a bad smell coming from your incision. ? Difficulty or pain when urinating. ? A sudden increase or decrease in the frequency of your bowel movements. ? More redness, swelling, or pain around your incision. ? More fluid or blood coming from your incision. ? A rash. ? Nausea. ? Little or no interest in activities you used to enjoy. ? Questions about caring for yourself or your baby.  Your incision feels warm to the touch.  Your breasts turn red or become painful or hard.  You feel unusually sad or worried.  You vomit.  You pass a blood clot from your vagina.  You urinate more than usual.  You are dizzy or light-headed. Get help right away if:  You have: ? Pain that does not go away or get better with  medicine. ? Chest pain. ? Difficulty breathing. ? Blurred vision or spots in your vision. ? Thoughts about hurting yourself or your baby. ? New pain in your abdomen or in one of your legs. ? A severe headache.  You faint.  You bleed from your vagina so much that you fill more than one sanitary pad in one hour. Bleeding should not be heavier than your heaviest period. Summary  After the procedure, it is common to have pain at your incision site, abdominal cramping, and slight bleeding from your vagina.  Check your incision area every day for signs of infection.  Tell your health care provider about any unusual symptoms.  Keep all follow-up visits for you and your baby as told by your health care provider. This information is not intended to replace advice given to you by your health care provider. Make sure you discuss any questions you have with your health care provider. Document Revised: 12/04/2017 Document Reviewed: 12/04/2017 Elsevier Patient Education  2020 [employer] Inc[employer]  

## 2019-06-19 NOTE — Discharge Summary (Signed)
Obstetric Discharge Summary Reason for Admission: rupture of membranes Prenatal Procedures: none Intrapartum Procedures: cesarean: low cervical, transverse Postpartum Procedures: none Complications-Operative and Postpartum: none Hemoglobin  Date Value Ref Range Status  06/18/2019 12.3 12.0 - 15.0 g/dL Final   HCT  Date Value Ref Range Status  06/18/2019 35.6 (L) 36.0 - 46.0 % Final    Physical Exam:  General: alert, cooperative and appears stated age 39: appropriate Uterine Fundus: firm Incision: healing well, no significant drainage, no dehiscence DVT Evaluation: No evidence of DVT seen on physical exam.  Discharge Diagnoses: Term Pregnancy-delivered  Discharge Information: Date: 06/19/2019 Activity: pelvic rest Diet: routine Medications: Ibuprofen Condition: improved Instructions: refer to practice specific booklet Discharge to: home   Newborn Data: Live born female  Birth Weight: 5 lb 11 oz (2580 g) APGAR: 6, 8  Newborn Delivery   Birth date/time: 06/[birth year]/2019 22:53:00 Delivery type: C-Section, Low Transverse Trial of labor: Yes C-section categorization: Primary      Home with mother.  Jeani Hawking 06/19/2019, 8:46 AM

## 2019-06-19 NOTE — Lactation Note (Signed)
This note was copied from a baby's chart. Lactation Consultation Note  Patient Name: Laura Logan [id number] Date: 06/19/2019 Reason for consult: Follow-up assessment;Infant < 6lbs;Early term 37-38.6wks Baby is 34 hours old/5% weight loss.  Mom reports that baby is breastfeeding well.  Baby is currently in the nursery for circumcision.  Discussed sleepiness after procedure.  Discussed milk coming to volume and the prevention and treatment of engorgement.  She has an electric pump at home.  Instructed to continue feeding with cues and keep a feeding diary of feeds and output.  Reviewed lactation outpatient services and encouraged to call prn.  Maternal Data    Feeding Feeding Type: Breast Fed  LATCH Score                   Interventions    Lactation Tools Discussed/Used     Consult Status Consult Status: Complete Follow-up type: Call as needed    Huston Foley 06/19/2019, 9:46 AM

## 2019-06-22 LAB — SURGICAL PATHOLOGY

## 2020-02-01 LAB — RPR: RPR Ser Ql: NONREACTIVE — AB

## 2020-12-05 DIAGNOSIS — Z01419 Encounter for gynecological examination (general) (routine) without abnormal findings: Secondary | ICD-10-CM | POA: Diagnosis not present

## 2020-12-05 DIAGNOSIS — Z1231 Encounter for screening mammogram for malignant neoplasm of breast: Secondary | ICD-10-CM | POA: Diagnosis not present

## 2020-12-05 DIAGNOSIS — Z6825 Body mass index (BMI) 25.0-25.9, adult: Secondary | ICD-10-CM | POA: Diagnosis not present

## 2020-12-08 DIAGNOSIS — R809 Proteinuria, unspecified: Secondary | ICD-10-CM | POA: Diagnosis not present

## 2020-12-08 DIAGNOSIS — I10 Essential (primary) hypertension: Secondary | ICD-10-CM | POA: Diagnosis not present

## 2020-12-08 DIAGNOSIS — R8290 Unspecified abnormal findings in urine: Secondary | ICD-10-CM | POA: Diagnosis not present

## 2020-12-08 DIAGNOSIS — Z1331 Encounter for screening for depression: Secondary | ICD-10-CM | POA: Diagnosis not present

## 2020-12-08 DIAGNOSIS — Z1339 Encounter for screening examination for other mental health and behavioral disorders: Secondary | ICD-10-CM | POA: Diagnosis not present

## 2020-12-19 DIAGNOSIS — I10 Essential (primary) hypertension: Secondary | ICD-10-CM | POA: Diagnosis not present

## 2021-01-04 ENCOUNTER — Other Ambulatory Visit: Payer: Self-pay | Admitting: Radiology

## 2021-01-04 DIAGNOSIS — Z803 Family history of malignant neoplasm of breast: Secondary | ICD-10-CM

## 2021-01-24 ENCOUNTER — Other Ambulatory Visit: Payer: Self-pay

## 2021-01-24 ENCOUNTER — Ambulatory Visit
Admission: RE | Admit: 2021-01-24 | Discharge: 2021-01-24 | Disposition: A | Discharge status: Home / Self Care | Payer: BC Managed Care – PPO | Source: Ambulatory Visit | Attending: Radiology | Admitting: Radiology

## 2021-01-24 DIAGNOSIS — Z803 Family history of malignant neoplasm of breast: Secondary | ICD-10-CM | POA: Diagnosis not present

## 2021-01-24 IMAGING — MR MR BREAST BILAT WO/W CM
8 of 12 series · 33 of 48 positions shown · IV contrast (6ml gadavist)
Comparison: None

CLINICAL DATA: High risk screening exam. Strong family history of
breast cancer.

LABS:  None.
EXAM:
BILATERAL BREAST MRI WITH AND WITHOUT CONTRAST
TECHNIQUE: Multiplanar, multisequence MR images of both breasts were obtained
prior to and following the intravenous administration of 6 ml of
Gadavist

[Series 2: t2_tirm_tra ipat (a-p) · axial · 3.0mm · 0.70mm/px · 1 of 55 slices shown]
[im 1/55]
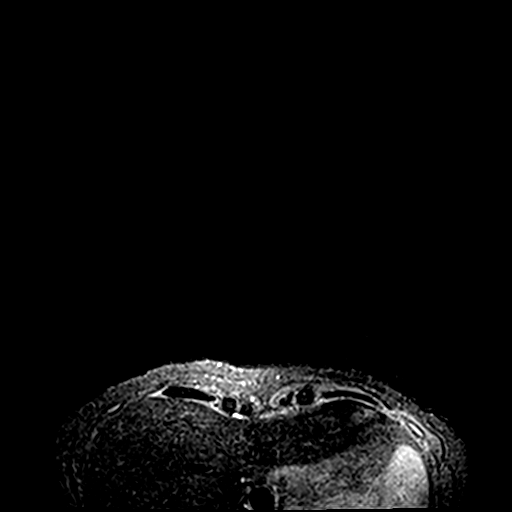

[Series 3: fl3d pre-cm no · axial · non-contrast · 1.2mm · 0.94mm/px · z∈[-84,+87]mm · 5 of 144 slices shown]
[im 1/144]
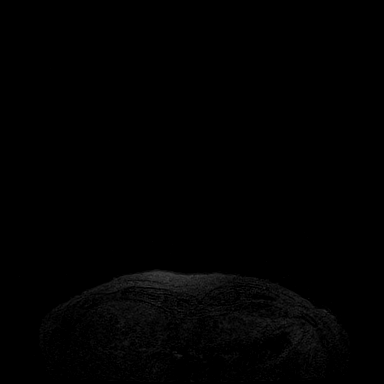
[im 36/144]
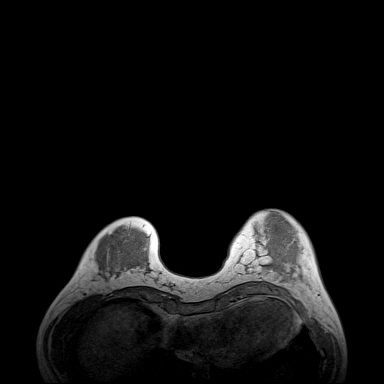
[im 72/144]
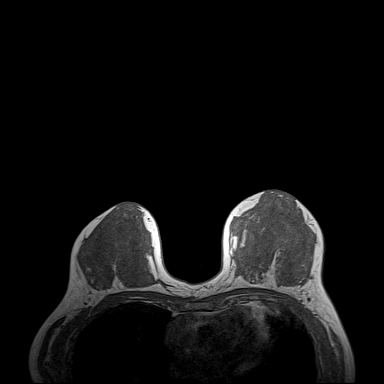
[im 108/144]
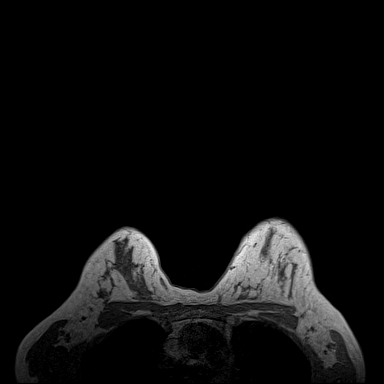
[im 144/144]
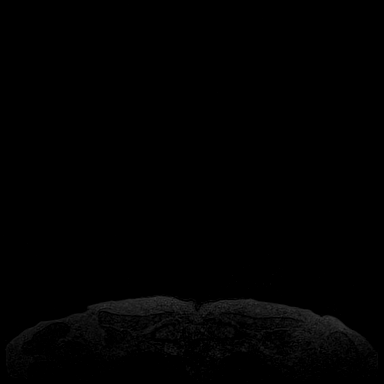

[Series 4: fl3d pre-cm · axial · non-contrast · 1.2mm · 0.94mm/px · z∈[-84,+87]mm · 5 of 144 slices shown]
[im 1/144]
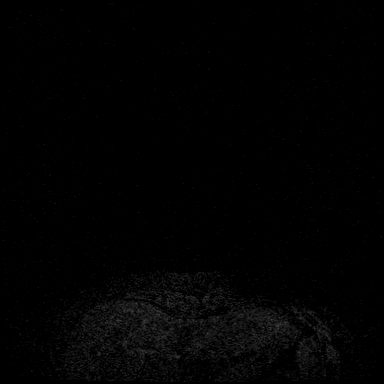
[im 36/144]
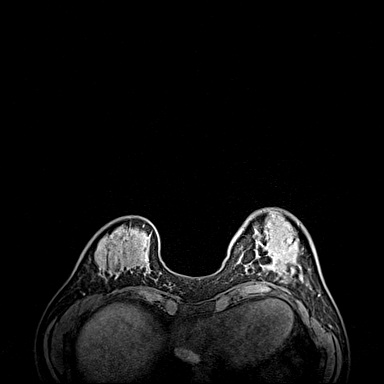
[im 72/144]
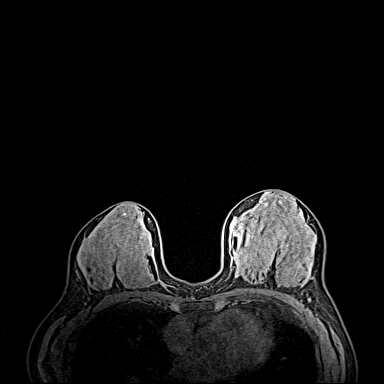
[im 108/144]
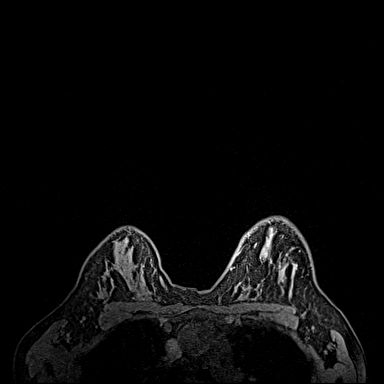
[im 144/144]
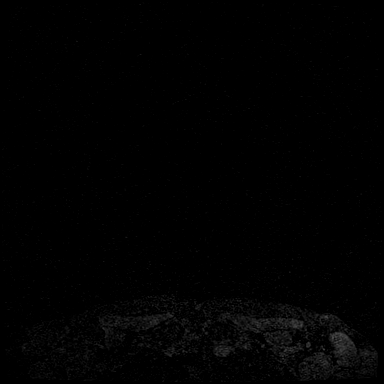

[Series 5: fl3d post immediate · axial · 1.2mm · 0.94mm/px · z∈[-84,+87]mm · 5 of 144 slices shown (1 of 3)]
[im 1/144]
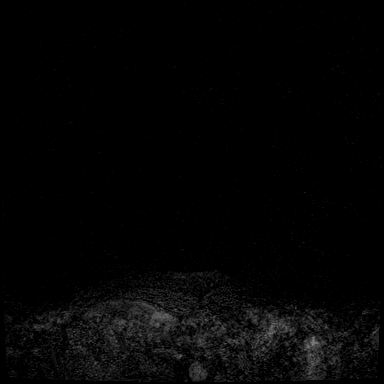
[im 36/144]
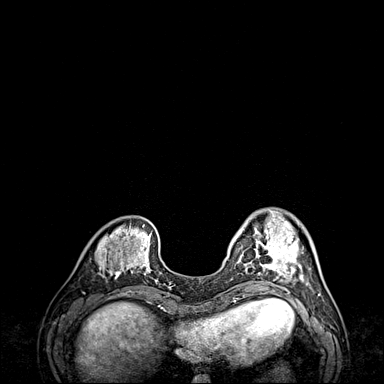
[im 72/144]
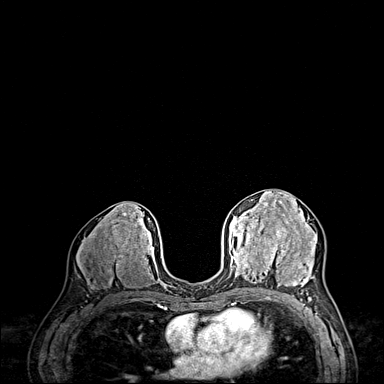
[im 108/144]
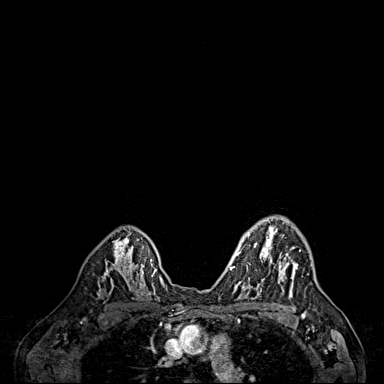
[im 144/144]
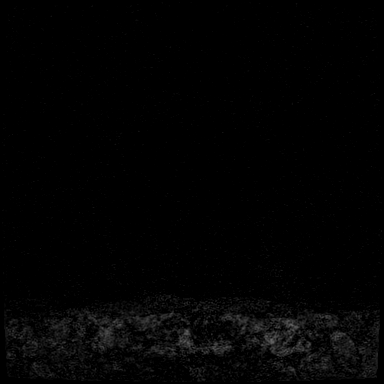

[Series 6: fl3d post immediate · axial · 1.2mm · 0.94mm/px · z∈[-84,+87]mm · 5 of 144 slices shown (2 of 3)]
[im 1/144]
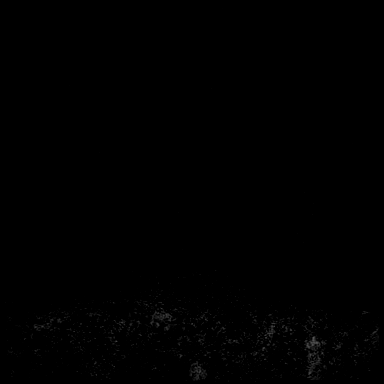
[im 36/144]
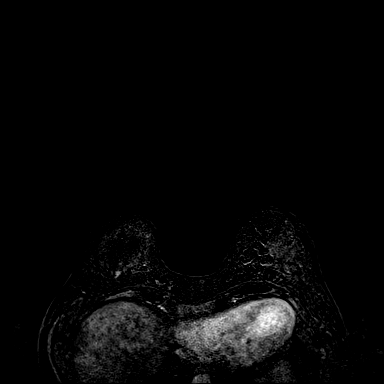
[im 72/144]
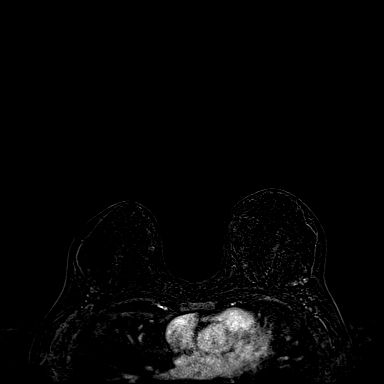
[im 108/144]
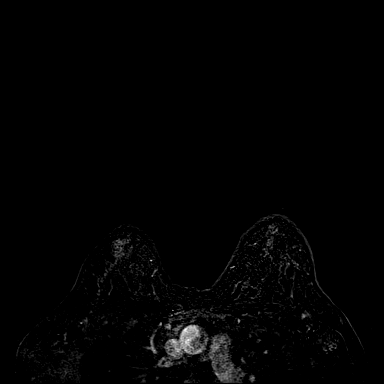
[im 144/144]
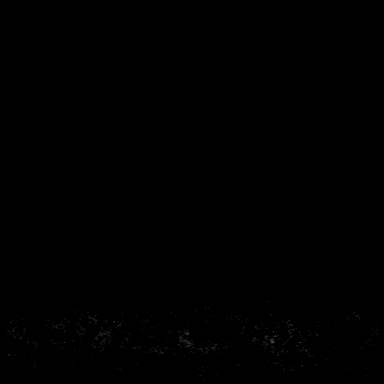

[Series 7: fl3d post immediate · axial · 172.8mm · 0.94mm/px · 1 of 1 slices shown (3 of 3)]
[im 1/1]
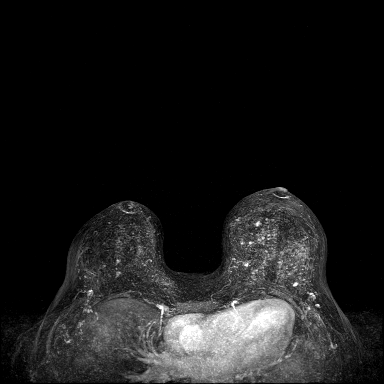

[Series 8: fl3d post 3min · axial · 1.2mm · 0.94mm/px · z∈[-84,+87]mm · 6 of 144 slices shown]
[im 1/144]
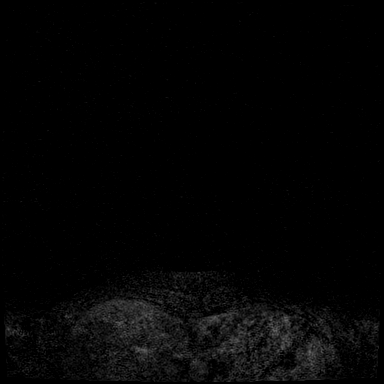
[im 29/144]
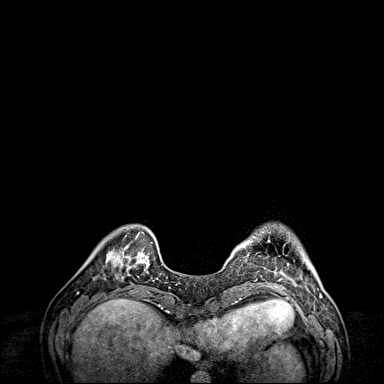
[im 58/144]
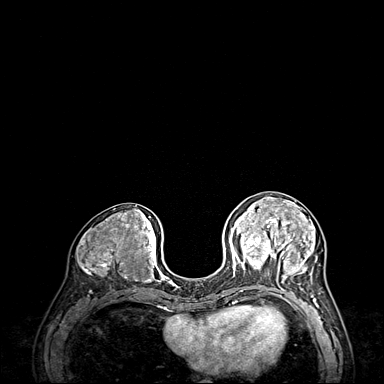
[im 86/144]
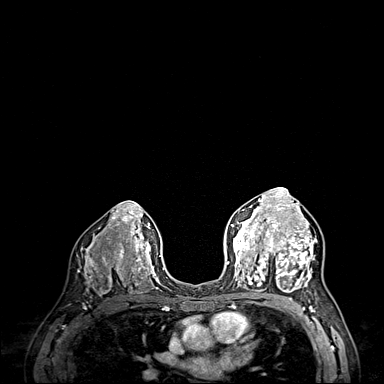
[im 115/144]
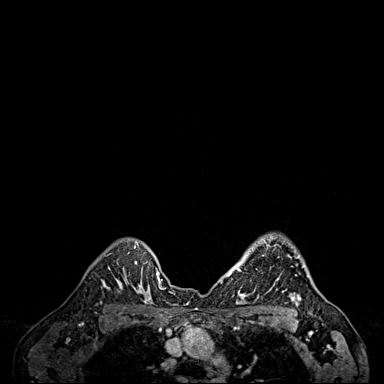
[im 144/144]
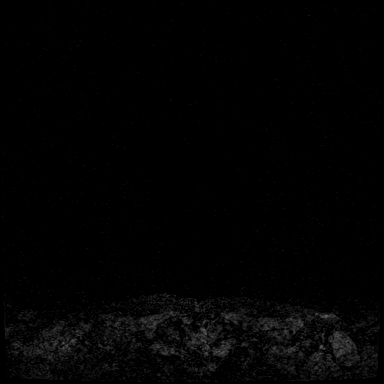

[Series 9: fl3d post 3min_sub · axial · 1.2mm · 0.94mm/px · z∈[-84,+53]mm · 5 of 144 slices shown]
[im 1/144]
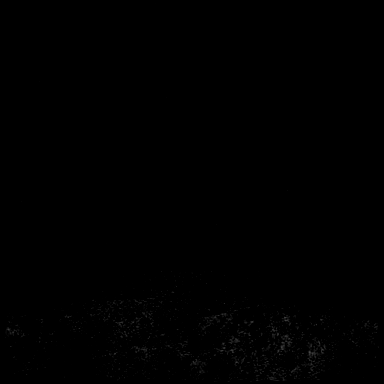
[im 29/144]
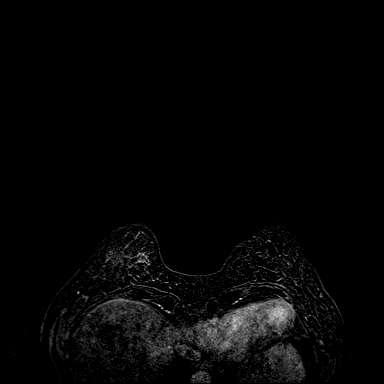
[im 58/144]
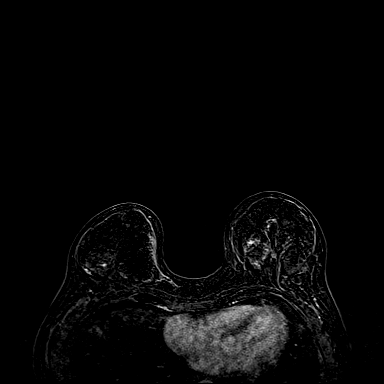
[im 86/144]
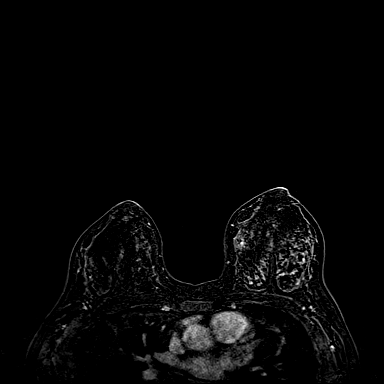
[im 115/144]
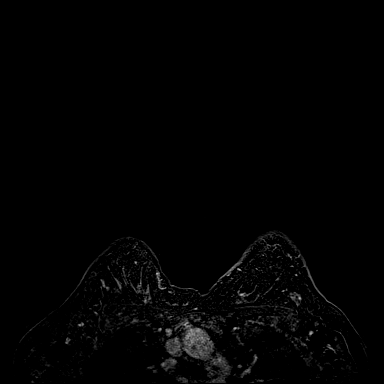

[33 of 48 positions shown; findings below may reference images not displayed]

Three-dimensional MR images were rendered by post-processing of the
original MR data on an independent workstation. The
three-dimensional MR images were interpreted, and findings are
reported in the following complete MRI report for this study. Three
dimensional images were evaluated at the independent interpreting
workstation using the DynaCAD thin client.
FINDINGS: Breast composition: d. Extreme fibroglandular tissue.

Background parenchymal enhancement: Moderate.

There is global asymmetric non mass enhancement in the superior left
breast, with benign kinetics, likely normal fibroglandular tissue.

There are multiple bilateral similar appearing subcentimeter
enhancing masses, none with washout kinetics. A representative mass
can be seen on series 9, image 68 in the left breast and on the
right on series 9, image 76.

Lymph nodes: No abnormal appearing lymph nodes.

Ancillary findings:  None.
IMPRESSION: Probably benign asymmetric global non mass enhancement in the
superior left breast as well as similar small bilateral enhancing
masses.

RECOMMENDATION:
Breast MRI in 6 months.

BI-RADS CATEGORY  3: Probably benign.

## 2021-01-24 MED ORDER — GADOBUTROL 1 MMOL/ML IV SOLN
6.0000 mL | Freq: Once | INTRAVENOUS | Status: AC | PRN
  Administered 2021-01-24: 6 mL via INTRAVENOUS

## 2021-01-30 ENCOUNTER — Other Ambulatory Visit: Payer: Self-pay | Admitting: Radiology

## 2021-01-30 DIAGNOSIS — R9389 Abnormal findings on diagnostic imaging of other specified body structures: Secondary | ICD-10-CM

## 2021-04-03 DIAGNOSIS — D2272 Melanocytic nevi of left lower limb, including hip: Secondary | ICD-10-CM | POA: Diagnosis not present

## 2021-04-03 DIAGNOSIS — L821 Other seborrheic keratosis: Secondary | ICD-10-CM | POA: Diagnosis not present

## 2021-04-03 DIAGNOSIS — D1801 Hemangioma of skin and subcutaneous tissue: Secondary | ICD-10-CM | POA: Diagnosis not present

## 2021-04-03 DIAGNOSIS — L918 Other hypertrophic disorders of the skin: Secondary | ICD-10-CM | POA: Diagnosis not present

## 2021-06-30 DIAGNOSIS — I10 Essential (primary) hypertension: Secondary | ICD-10-CM | POA: Diagnosis not present

## 2021-07-07 DIAGNOSIS — Z1212 Encounter for screening for malignant neoplasm of rectum: Secondary | ICD-10-CM | POA: Diagnosis not present

## 2021-07-07 DIAGNOSIS — Z0000 Encounter for general adult medical examination without abnormal findings: Secondary | ICD-10-CM | POA: Diagnosis not present

## 2021-07-07 DIAGNOSIS — R809 Proteinuria, unspecified: Secondary | ICD-10-CM | POA: Diagnosis not present

## 2021-07-07 DIAGNOSIS — I10 Essential (primary) hypertension: Secondary | ICD-10-CM | POA: Diagnosis not present

## 2021-07-07 DIAGNOSIS — R82998 Other abnormal findings in urine: Secondary | ICD-10-CM | POA: Diagnosis not present

## 2021-09-01 ENCOUNTER — Other Ambulatory Visit: Payer: Self-pay

## 2021-09-01 ENCOUNTER — Ambulatory Visit
Admission: RE | Admit: 2021-09-01 | Discharge: 2021-09-01 | Disposition: A | Discharge status: Home / Self Care | Payer: BC Managed Care – PPO | Source: Ambulatory Visit | Attending: Radiology | Admitting: Radiology

## 2021-09-01 DIAGNOSIS — N6489 Other specified disorders of breast: Secondary | ICD-10-CM | POA: Diagnosis not present

## 2021-09-01 DIAGNOSIS — R9389 Abnormal findings on diagnostic imaging of other specified body structures: Secondary | ICD-10-CM

## 2021-09-01 IMAGING — MR MR BREAST BILAT WO/W CM
8 of 12 series · 32 of 48 positions shown · IV contrast (gadavist)
Comparison: Previous exam(s).

CLINICAL DATA: 40-year-old female presenting for short-term
follow-up of left breast asymmetrical non mass enhancement.

EXAM:
BILATERAL BREAST MRI WITH AND WITHOUT CONTRAST
TECHNIQUE: Multiplanar, multisequence MR images of both breasts were obtained
prior to and following the intravenous administration of 6 ml of
Gadavist

[Series 2: t2_tirm_tra ipat (a-p) · axial · 3.0mm · 0.70mm/px · 1 of 55 slices shown]
[im 1/55]
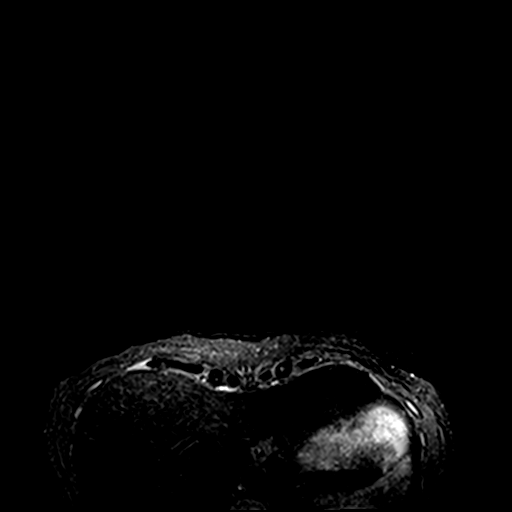

[Series 3: fl3d pre-cm no · axial · non-contrast · 1.2mm · 0.94mm/px · z∈[-69,+102]mm · 5 of 144 slices shown]
[im 1/144]
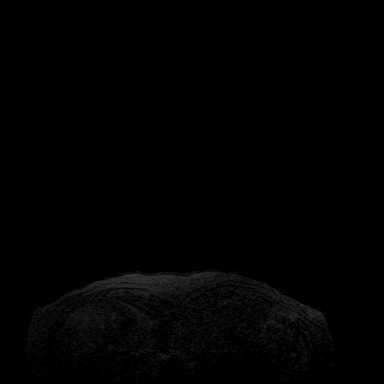
[im 36/144]
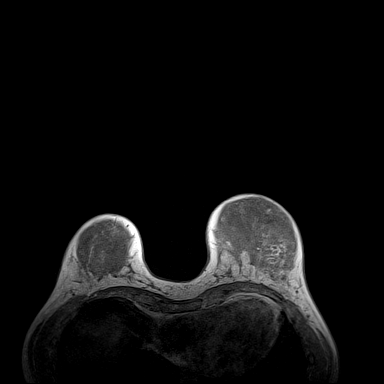
[im 72/144]
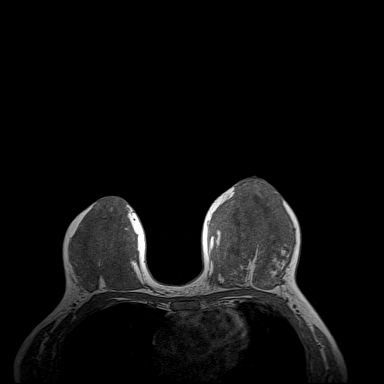
[im 108/144]
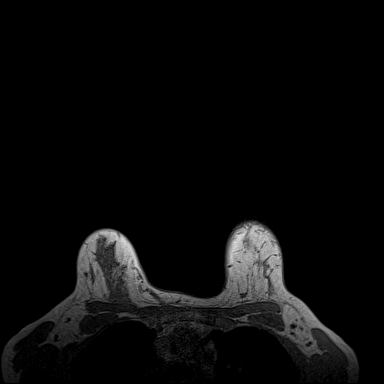
[im 144/144]
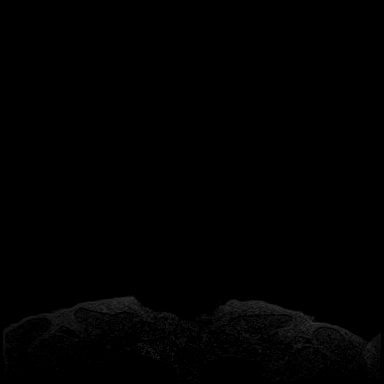

[Series 4: fl3d pre-cm · axial · non-contrast · 1.2mm · 0.94mm/px · z∈[-69,+102]mm · 5 of 144 slices shown]
[im 1/144]
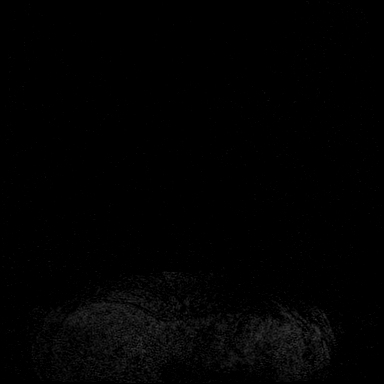
[im 36/144]
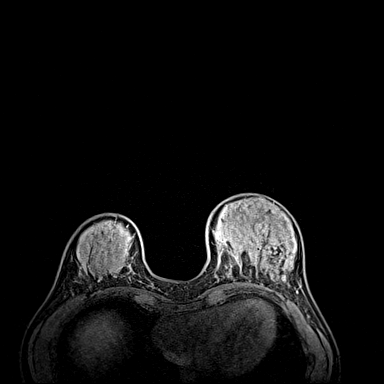
[im 72/144]
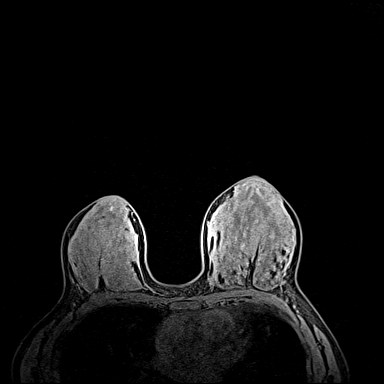
[im 108/144]
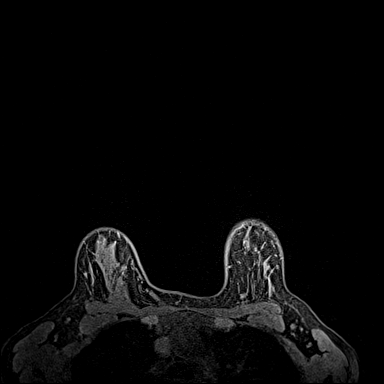
[im 144/144]
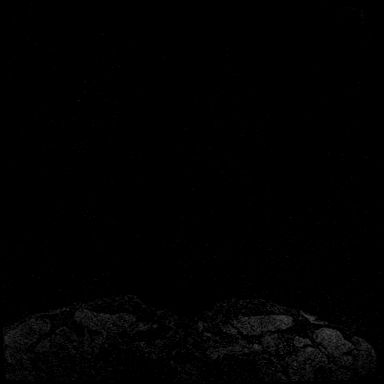

[Series 5: fl3d post immediate · axial · 1.2mm · 0.94mm/px · z∈[-69,+102]mm · 5 of 144 slices shown (1 of 3)]
[im 1/144]
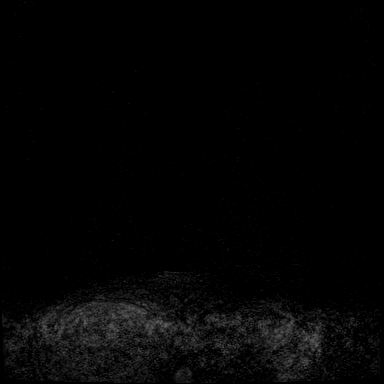
[im 36/144]
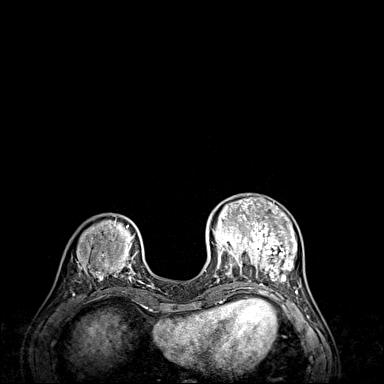
[im 72/144]
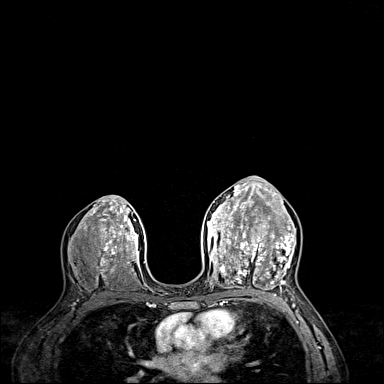
[im 108/144]
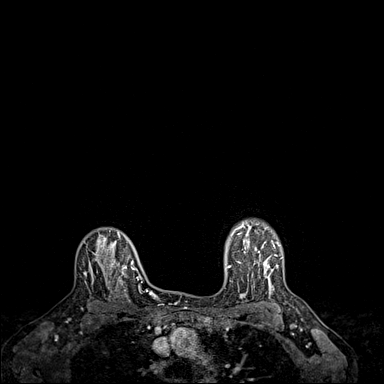
[im 144/144]
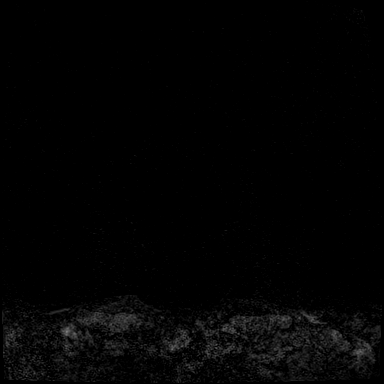

[Series 6: fl3d post immediate · axial · 1.2mm · 0.94mm/px · z∈[-69,+102]mm · 5 of 144 slices shown (2 of 3)]
[im 1/144]
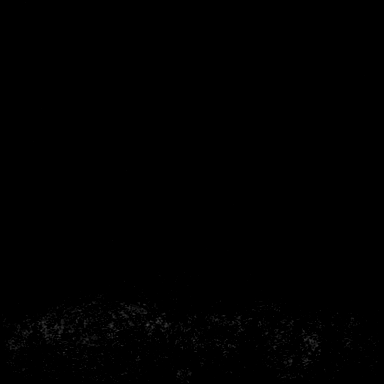
[im 36/144]
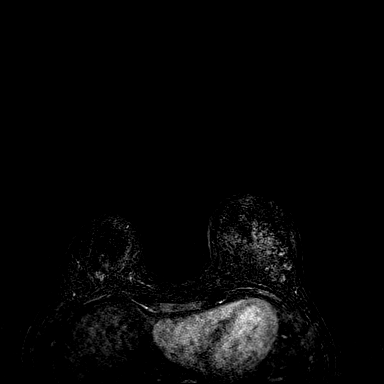
[im 72/144]
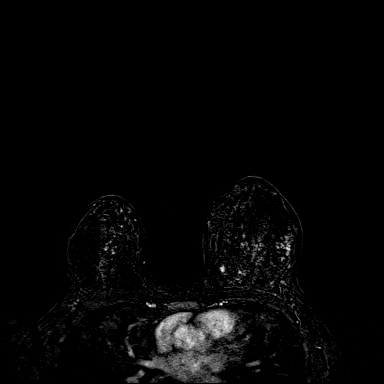
[im 108/144]
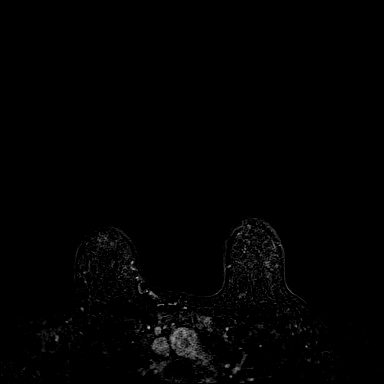
[im 144/144]
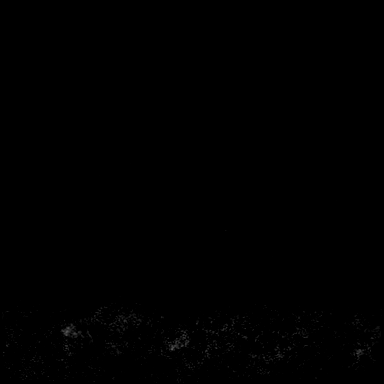

[Series 7: fl3d post immediate · axial · 172.8mm · 0.94mm/px · 1 of 1 slices shown (3 of 3)]
[im 1/1]
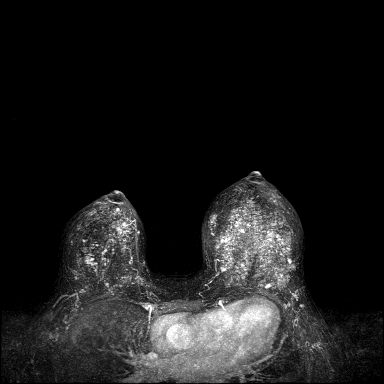

[Series 8: fl3d post 3min · axial · 1.2mm · 0.94mm/px · z∈[-69,+102]mm · 6 of 144 slices shown]
[im 1/144]
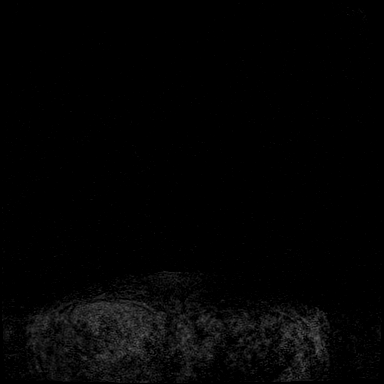
[im 29/144]
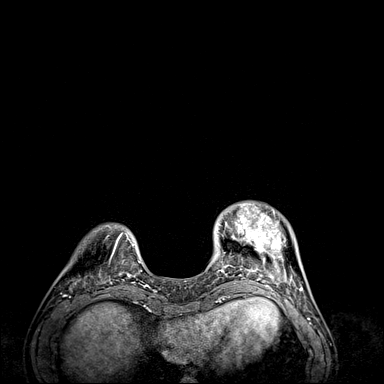
[im 58/144]
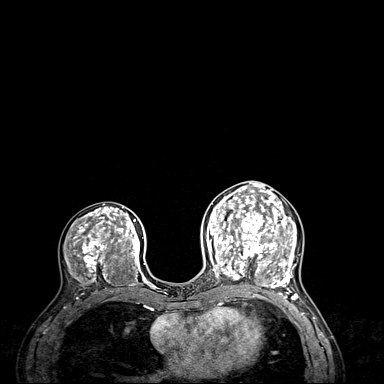
[im 86/144]
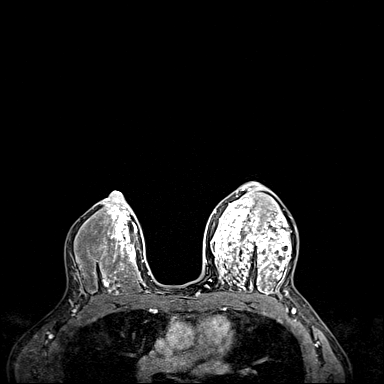
[im 115/144]
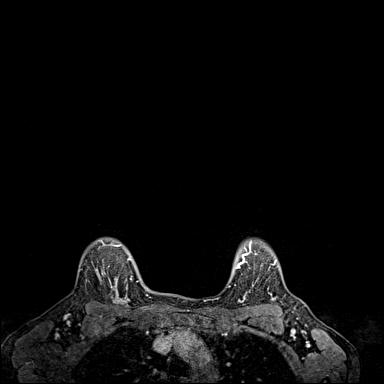
[im 144/144]
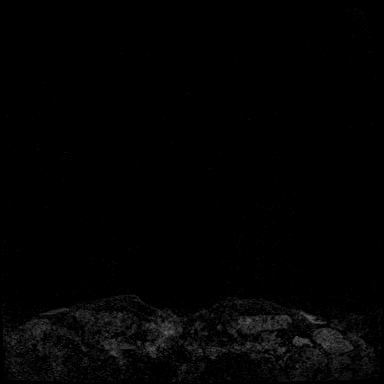

[Series 9: fl3d post 3min_sub · axial · 1.2mm · 0.94mm/px · z∈[-69,+33]mm · 4 of 144 slices shown]
[im 1/144]
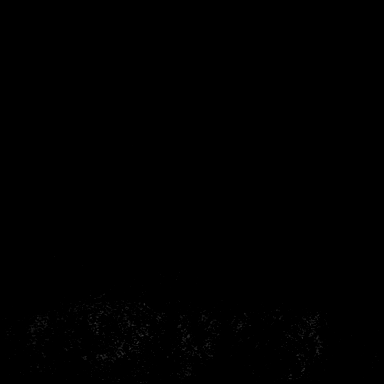
[im 29/144]
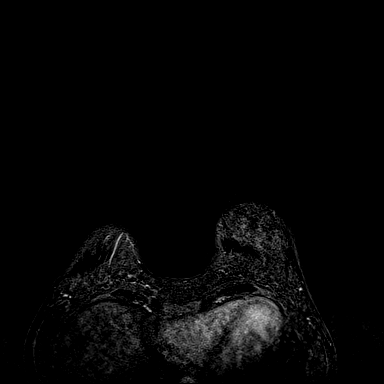
[im 58/144]
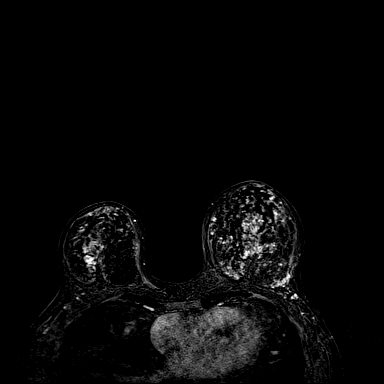
[im 86/144]
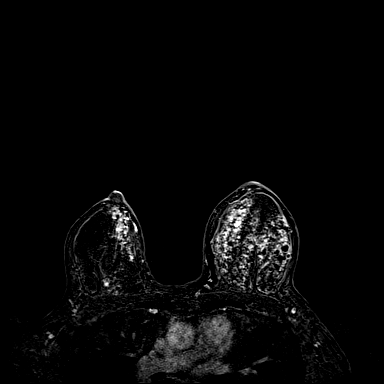

[32 of 48 positions shown; findings below may reference images not displayed]

Three-dimensional MR images were rendered by post-processing of the
original MR data on an independent workstation. The
three-dimensional MR images were interpreted, and findings are
reported in the following complete MRI report for this study. Three
dimensional images were evaluated at the independent interpreting
workstation using the DynaCAD thin client.
FINDINGS: Breast composition: d. Extreme fibroglandular tissue.

Background parenchymal enhancement: Marked

Right breast: There is scattered diffuse patchy areas of non mass
enhancement throughout the right breast, new from the prior exam.
These areas are most concentrated in the medial anterior right
breast (series 6, image 60) spanning 5.3, and in the lower outer
right breast (series 6, image 88) spanning 2.6 cm.

Left breast: There is global increase diffuse non mass enhancement
throughout the left breast (example in series 6, image 56). This is
most prominent in the superomedial and lateral breast, and in the
far inferior medial and lateral breast example in series 6, image
95).

Lymph nodes: No abnormal appearing lymph nodes.

Ancillary findings:  None.
IMPRESSION: Multiple bilateral indeterminate areas of non mass enhancement.

RECOMMENDATION:
Two site MRI guided biopsy is recommended for distant sites in the
superior and inferior left breast, and a single site MRI guided
biopsy in 1 of the concentrated areas of non mass enhancement in the
right breast.

I recommend that the MRI guided biopsy be scheduled for a different
time in the menstrual cycle than when this MRI was performed in case
this enhancement is related to background parenchymal enhancement.

BI-RADS CATEGORY  4: Suspicious.

## 2021-09-01 MED ORDER — GADOBUTROL 1 MMOL/ML IV SOLN
6.0000 mL | Freq: Once | INTRAVENOUS | Status: AC | PRN
  Administered 2021-09-01: 6 mL via INTRAVENOUS

## 2021-09-05 ENCOUNTER — Other Ambulatory Visit: Payer: Self-pay | Admitting: Obstetrics and Gynecology

## 2021-09-05 NOTE — Progress Notes (Signed)
Please send results to [facility] so Dr Vincente Poli is aware of findings. Thanks

## 2021-09-06 ENCOUNTER — Other Ambulatory Visit: Payer: Self-pay | Admitting: Obstetrics and Gynecology

## 2021-09-06 DIAGNOSIS — R9389 Abnormal findings on diagnostic imaging of other specified body structures: Secondary | ICD-10-CM

## 2021-09-21 ENCOUNTER — Ambulatory Visit
Admission: RE | Admit: 2021-09-21 | Discharge: 2021-09-21 | Disposition: A | Discharge status: Home / Self Care | Payer: BC Managed Care – PPO | Source: Ambulatory Visit | Attending: Obstetrics and Gynecology | Admitting: Obstetrics and Gynecology

## 2021-09-21 DIAGNOSIS — R9389 Abnormal findings on diagnostic imaging of other specified body structures: Secondary | ICD-10-CM

## 2021-09-21 DIAGNOSIS — N6012 Diffuse cystic mastopathy of left breast: Secondary | ICD-10-CM | POA: Diagnosis not present

## 2021-09-21 DIAGNOSIS — N6011 Diffuse cystic mastopathy of right breast: Secondary | ICD-10-CM | POA: Diagnosis not present

## 2021-09-21 DIAGNOSIS — R928 Other abnormal and inconclusive findings on diagnostic imaging of breast: Secondary | ICD-10-CM | POA: Diagnosis not present

## 2021-09-21 IMAGING — MG MM BREAST LOCALIZATION CLIP
4 series · 4 of 12 positions shown · non-contrast
Comparison: Previous exam(s).

CLINICAL DATA: Post procedure mammogram for clip placement

EXAM:
3D DIAGNOSTIC BILATERAL MAMMOGRAM POST MRI BIOPSY

[L CC synth-2D]
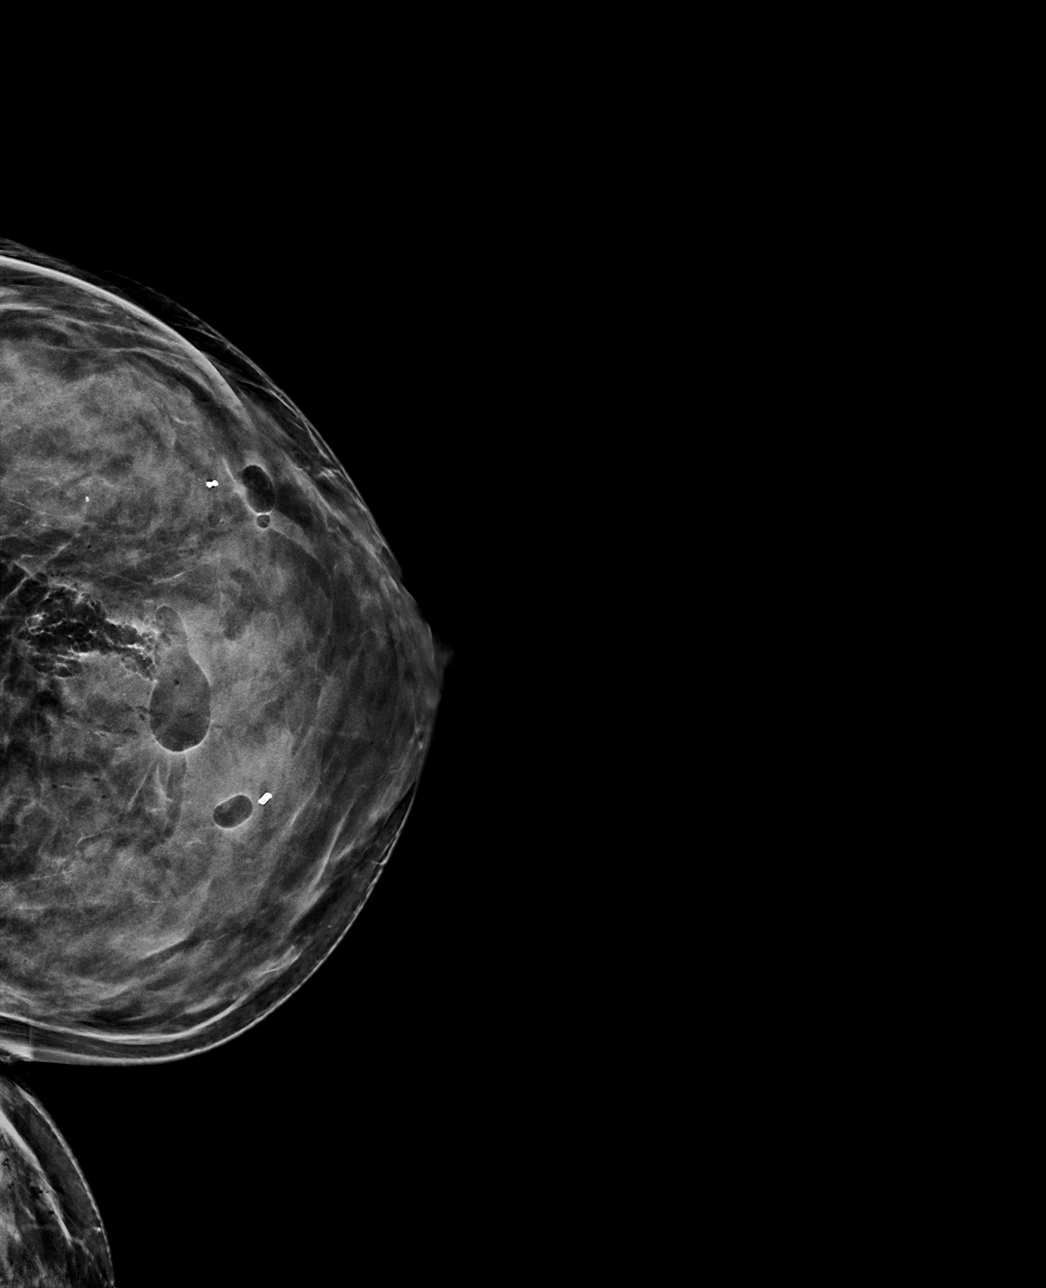

[L ML synth-2D]
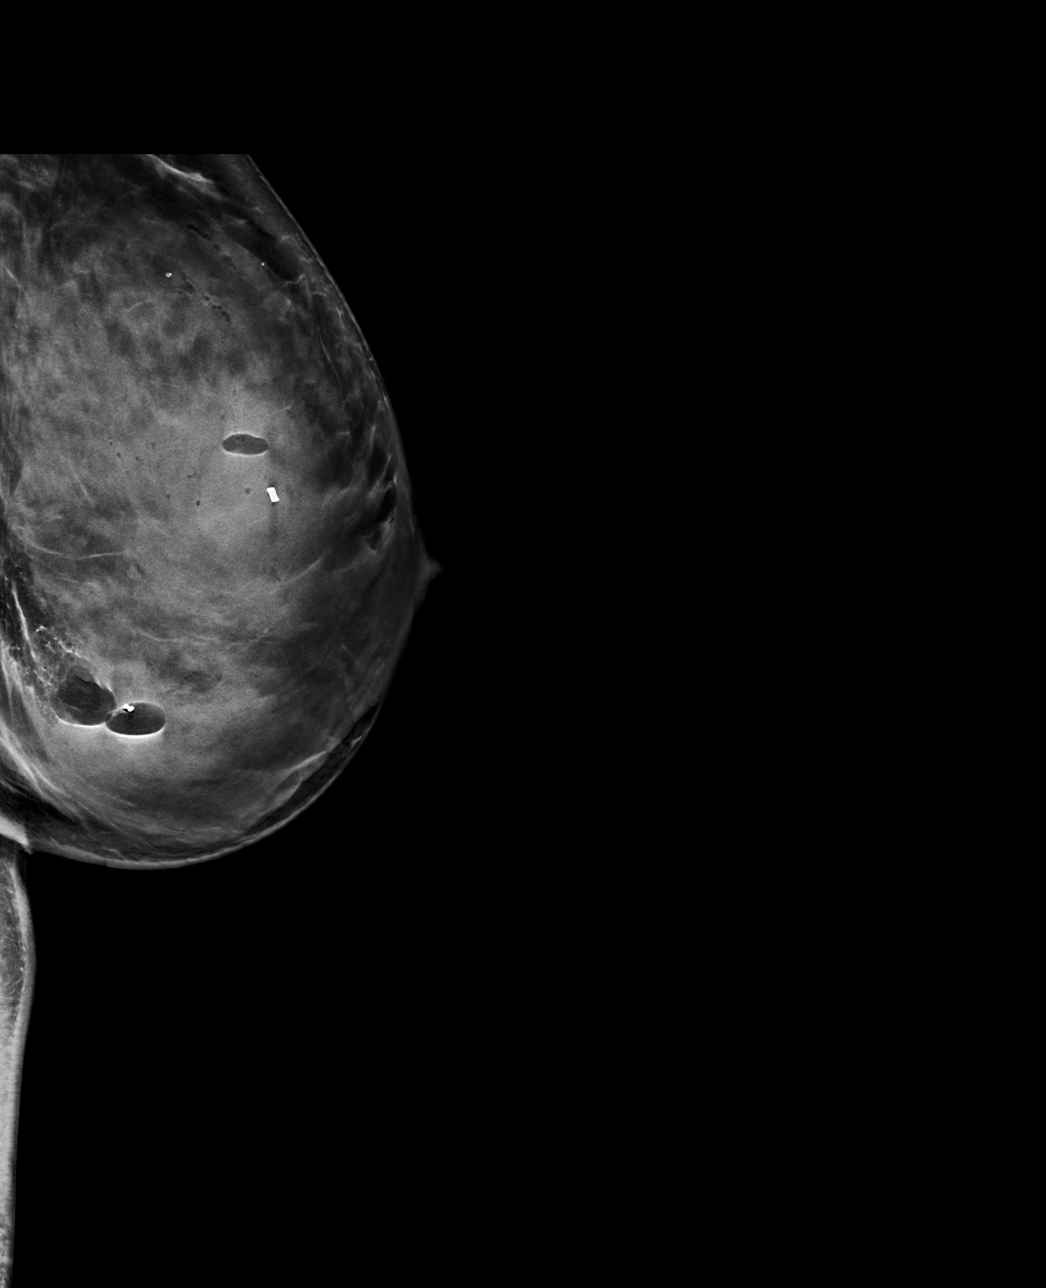

[L CC tomo · tomo slice 36/71.0]
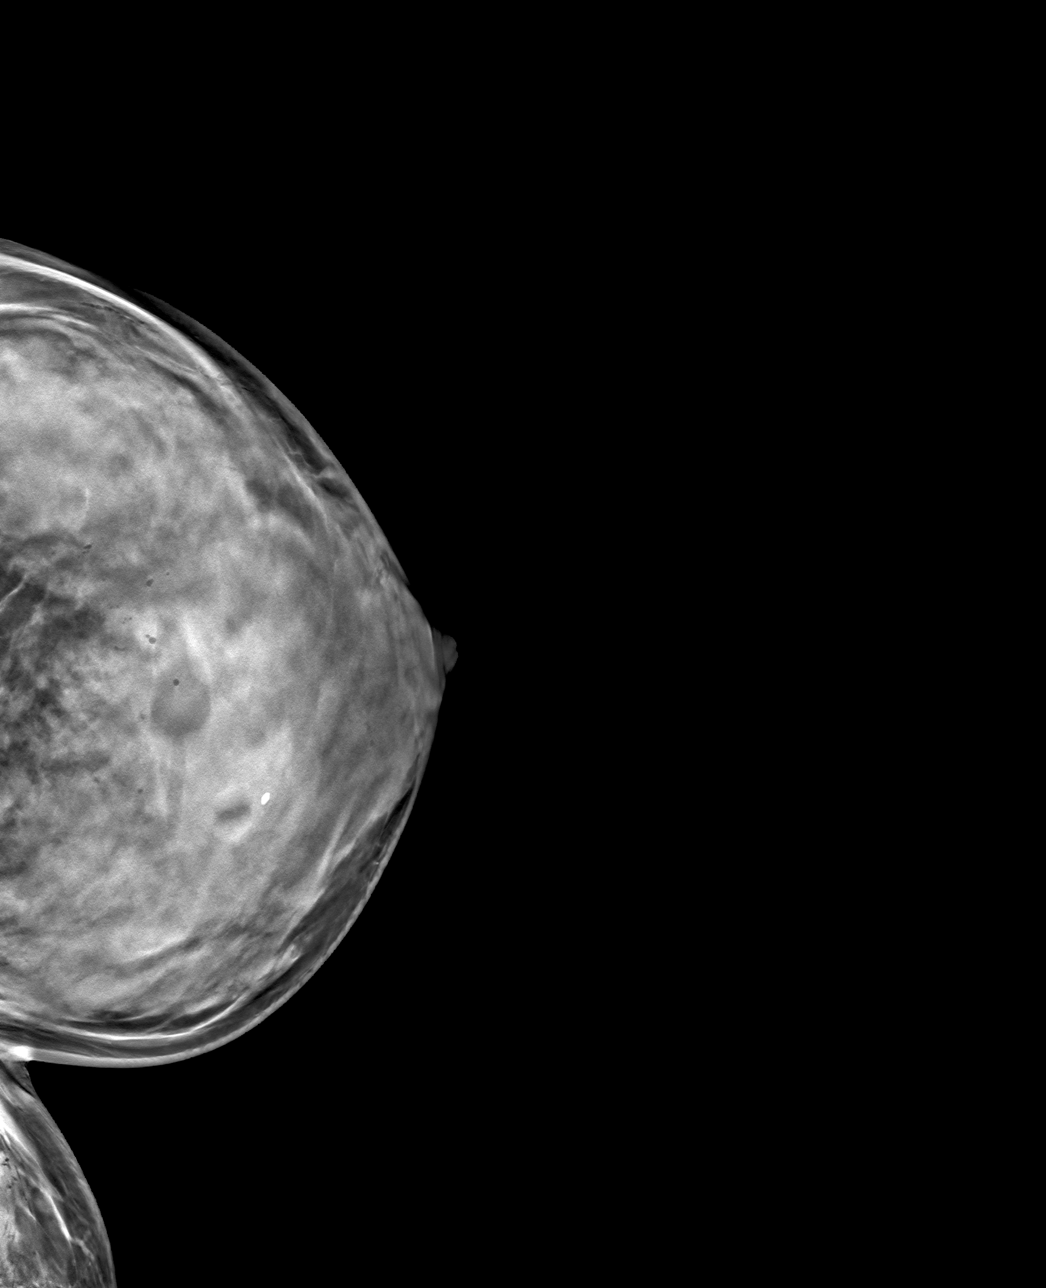

[L ML tomo · tomo slice 43/86.0]
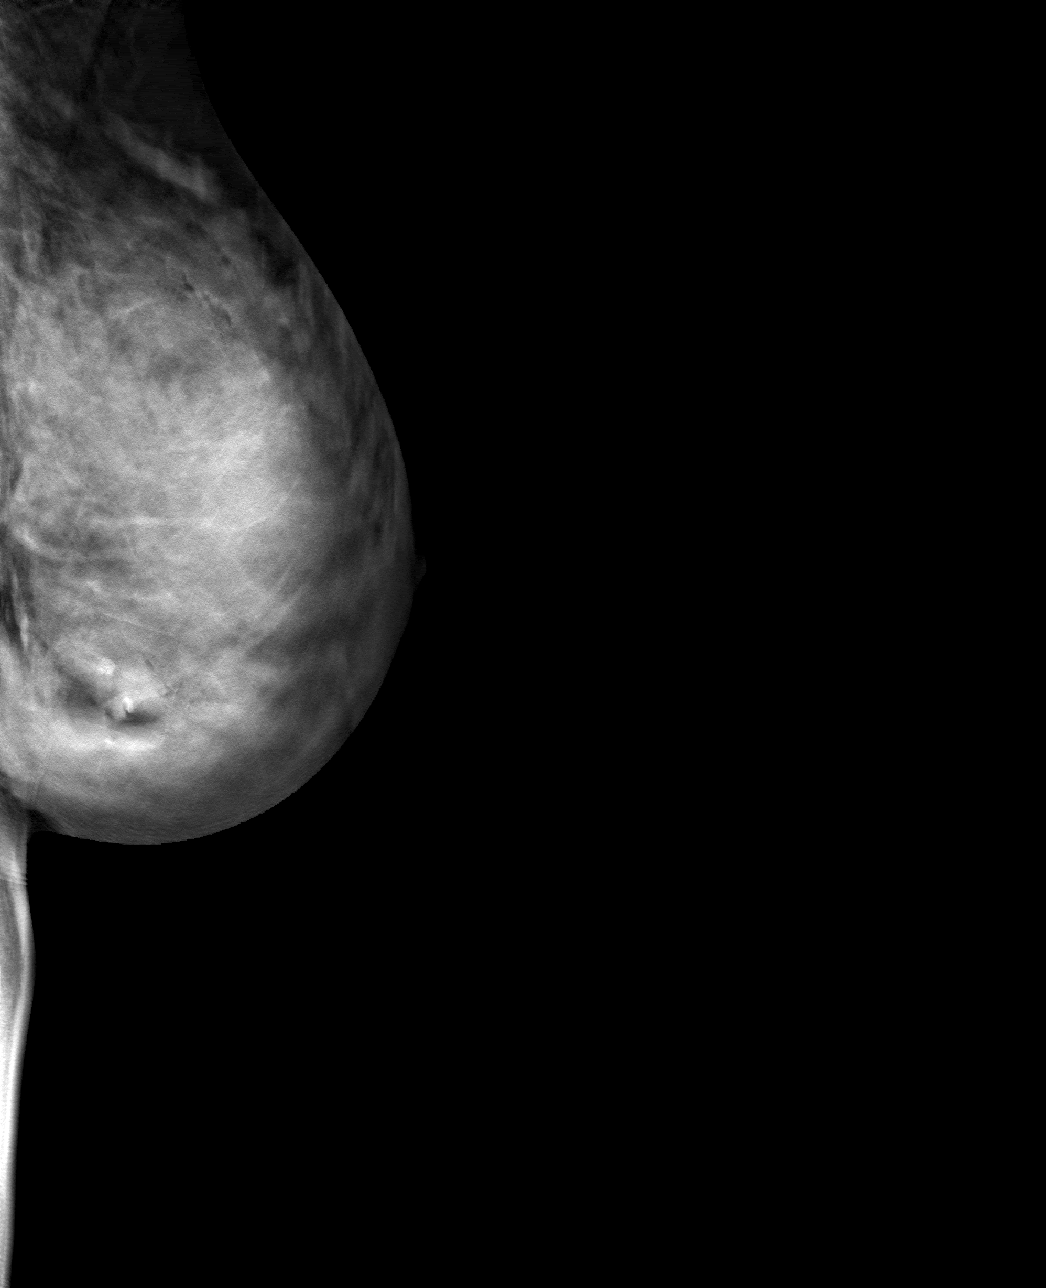

[4 of 12 positions shown; findings below may reference images not displayed]

FINDINGS: Right breast:

3D Mammographic images were obtained following MRI guided biopsy of
non mass enhancement in the medial right breast. The barbell biopsy
marking clip is in expected position at the site of biopsy.

Left breast:

3D Mammographic images were obtained following MRI guided biopsy of
non mass enhancement in the upper inner left breast. The cylinder
biopsy marking clip is in expected position at the site of biopsy.

3D Mammographic images were obtained following MRI guided biopsy of
non mass enhancement in the lower central left breast. The barbell
biopsy marking clip appears displaced laterally from the biopsy site
by approximately 3 cm.
IMPRESSION: Right breast:

Appropriate positioning of the barbell shaped biopsy marking clip at
the site of biopsy in the medial right breast.

Left breast:

Appropriate positioning of the cylinder shaped biopsy marking clip
at the site of biopsy in the upper inner left breast.

Displacement of the barbell shaped biopsy marking clip in the left
breast by approximately 3 cm laterally from the biopsy site in the
lower central left breast.

Final Assessment: Post Procedure Mammograms for Marker Placement

## 2021-09-21 IMAGING — MR MR BREAST BX W LOC DEV EA ADD LESION IMAGE BX SPEC MR GUIDE*L*
8 of 12 series · 30 of 48 positions shown · IV contrast (10 ml gadavist)
Comparison: Previous exams.
COMPARISON: Previous exams.

Addendum:
CLINICAL DATA: 40-year-old female presenting for biopsy of
bilateral non mass enhancement.

EXAM:
MRI GUIDED CORE NEEDLE BIOPSY OF THE RIGHT BREAST
MRI GUIDED CORE NEEDLE BIOPSY OF THE LEFT BREAST
TECHNIQUE: Multiplanar, multisequence MR imaging of the bilateral breasts was
performed both before and after administration of intravenous
contrast.
CONTRAST:  10mL GADAVIST GADOBUTROL 1 MMOL/ML IV SOLN

[Series 2: fiducial bilateral · sagittal · 2.0mm · 1.33mm/px · 4 of 176 slices shown]
[im 1/176]
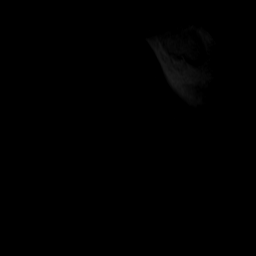
[im 59/176]
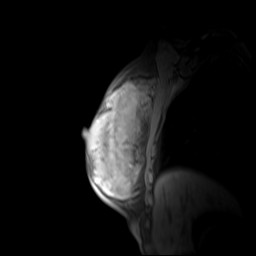
[im 117/176]
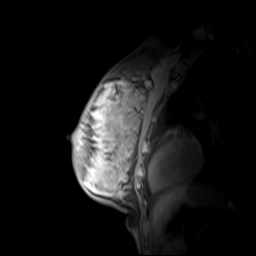
[im 176/176]
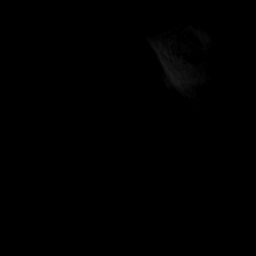

[Series 3: dynamic pre · axial · non-contrast · 1.3mm · 0.73mm/px · z∈[-102,+84]mm · 4 of 144 slices shown]
[im 1/144]
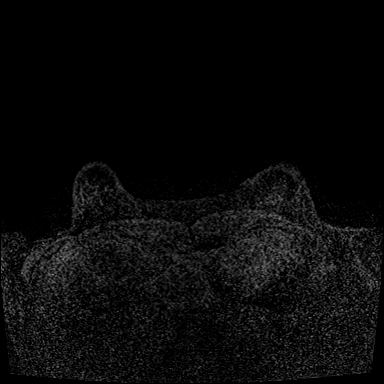
[im 48/144]
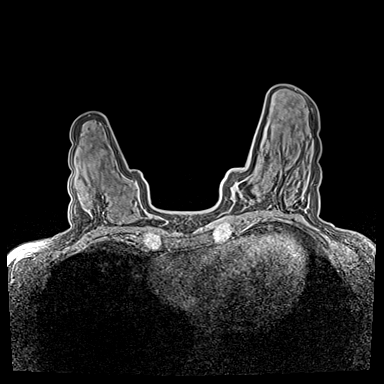
[im 96/144]
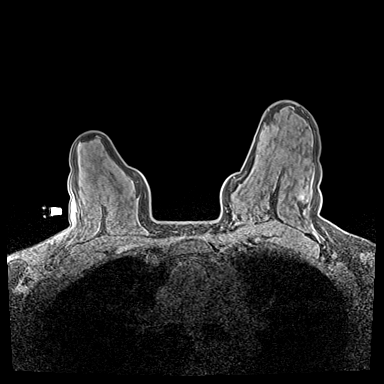
[im 144/144]
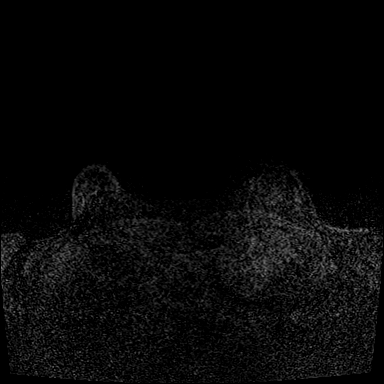

[Series 4: dynamic post 20 · axial · 1.3mm · 0.73mm/px · z∈[-102,+84]mm · 4 of 144 slices shown (1 of 2)]
[im 1/144]
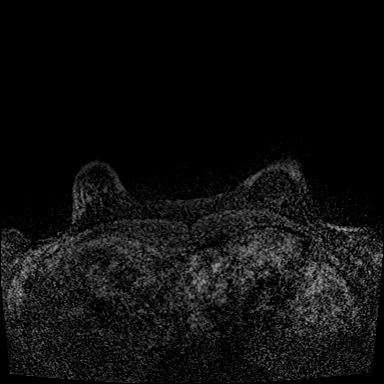
[im 48/144]
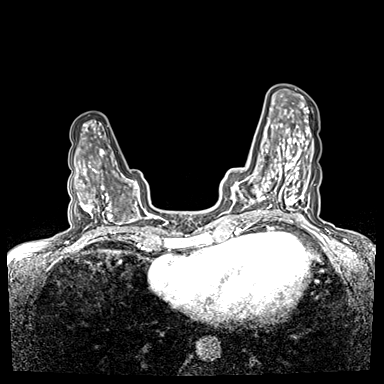
[im 96/144]
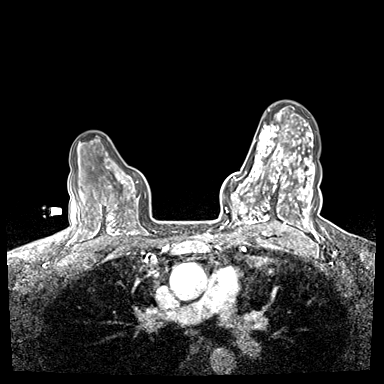
[im 144/144]
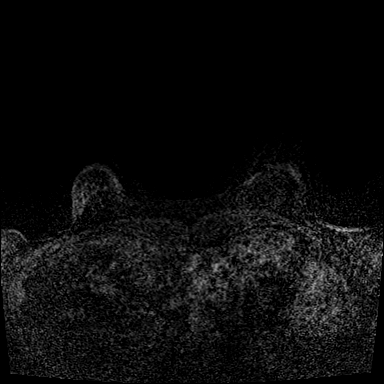

[Series 5: dynamic post 20 · axial · 1.3mm · 0.73mm/px · z∈[-102,+84]mm · 4 of 144 slices shown (2 of 2)]
[im 1/144]
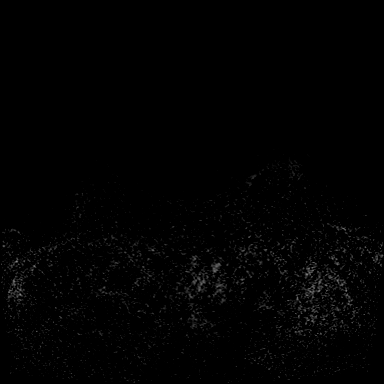
[im 48/144]
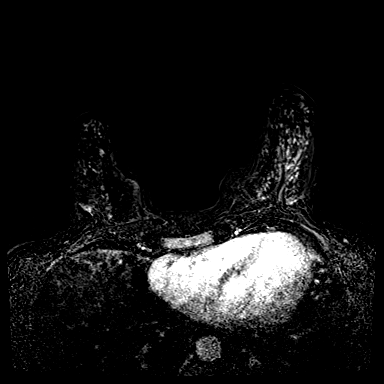
[im 96/144]
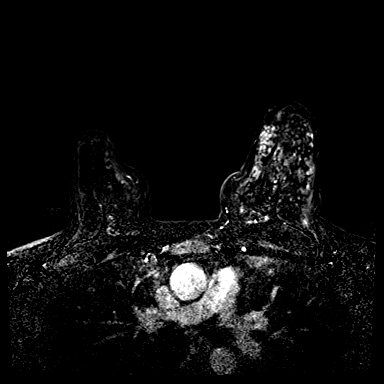
[im 144/144]
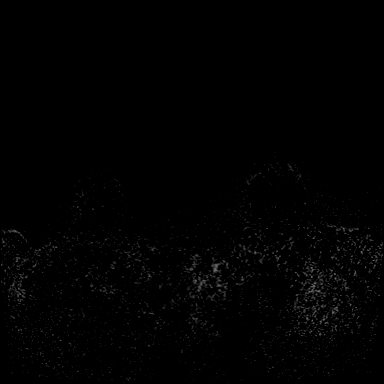

[Series 6: dynamic post 3 · axial · 1.3mm · 0.73mm/px · z∈[-102,+84]mm · 4 of 144 slices shown (1 of 2)]
[im 1/144]
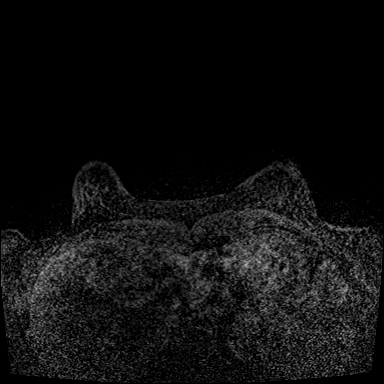
[im 48/144]
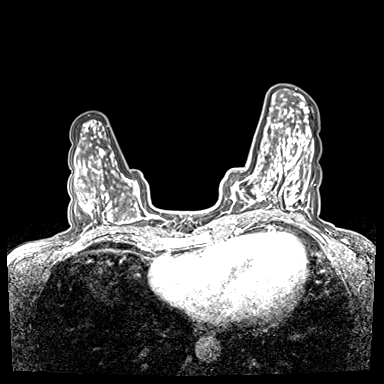
[im 96/144]
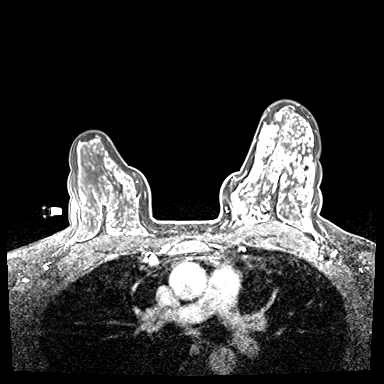
[im 144/144]
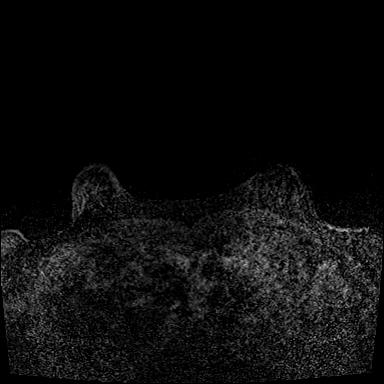

[Series 7: dynamic post 3 · axial · 1.3mm · 0.73mm/px · z∈[-102,+84]mm · 4 of 144 slices shown (2 of 2)]
[im 1/144]
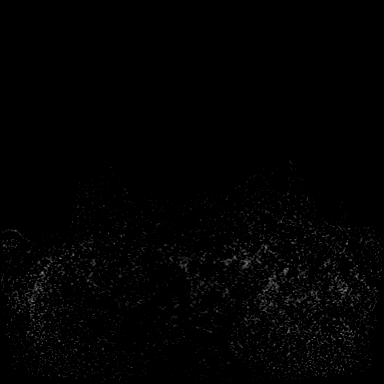
[im 48/144]
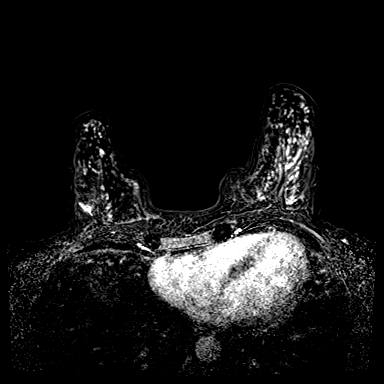
[im 96/144]
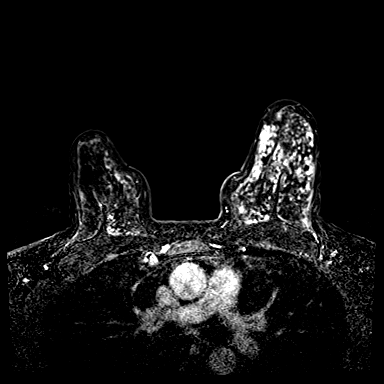
[im 144/144]
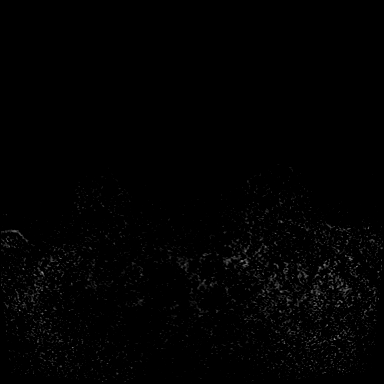

[Series 8: needle confirmation · axial · 1.3mm · 0.73mm/px · z∈[-102,+84]mm · 4 of 144 slices shown]
[im 1/144]
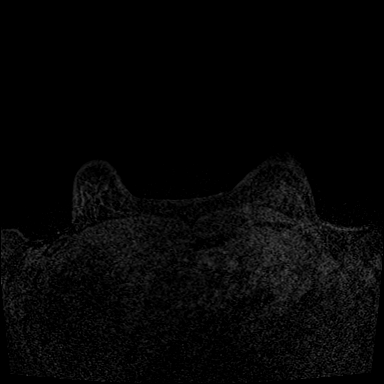
[im 48/144]
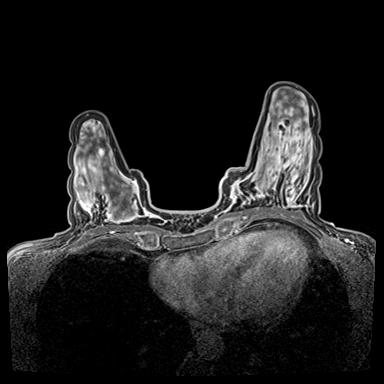
[im 96/144]
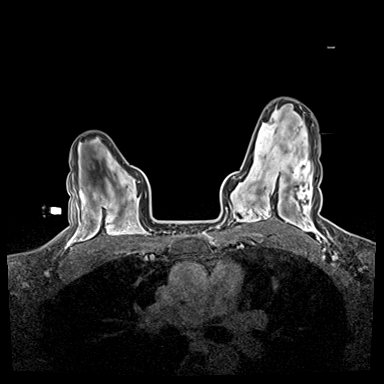
[im 144/144]
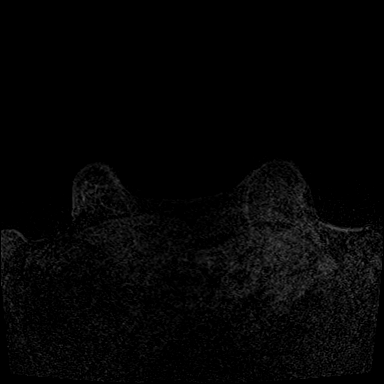

[Series 9: needle confirmation_sub · axial · 1.3mm · 0.73mm/px · z∈[-102,-41]mm · 2 of 144 slices shown]
[im 1/144]
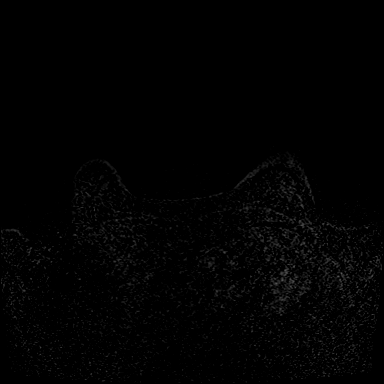
[im 48/144]
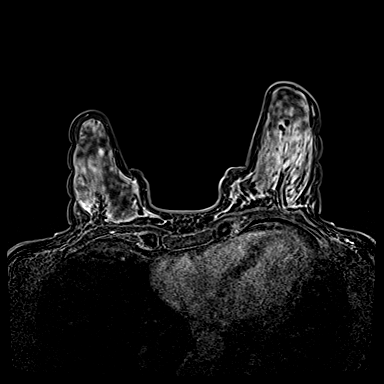

[30 of 48 positions shown; findings below may reference images not displayed]

FINDINGS: I met with the patient, and we discussed the procedure of MRI guided
biopsy, including risks, benefits, and alternatives. Specifically,
we discussed the risks of infection, bleeding, tissue injury, clip
migration, and inadequate sampling. Informed, written consent was
given. The usual time out protocol was performed immediately prior
to the procedure.

1. Using sterile technique, 1% Lidocaine, MRI guidance, and a 9
gauge vacuum assisted device, biopsy was performed of non mass
enhancement in the medial right breast using a lateral approach. At
the conclusion of the procedure, a a barbell tissue marker clip was
deployed into the biopsy cavity. Follow-up 2-view mammogram was
performed and dictated separately.

2. Using sterile technique, 1% Lidocaine, MRI guidance, and a 9
gauge vacuum assisted device, biopsy was performed of non mass
enhancement in the upper inner left breast using a lateral approach.
At the conclusion of the procedure, a a cylinder tissue marker clip
was deployed into the biopsy cavity. Follow-up 2-view mammogram was
performed and dictated separately.

3. Using sterile technique, 1% Lidocaine, MRI guidance, and a 9
gauge vacuum assisted device, biopsy was performed of non mass
enhancement in the lower central left breast using a lateral
approach. At the conclusion of the procedure, a a barbell tissue
marker clip was deployed into the biopsy cavity. Follow-up 2-view
mammogram was performed and dictated separately.
IMPRESSION: MRI guided biopsy of non mass enhancement in the medial right
breast, of non mass enhancement in the upper inner left breast, and
of non mass enhancement in the lower central left breast. No
apparent complications.

ADDENDUM:
Pathology revealed FIBROCYSTIC CHANGES WITH USUAL EPITHELIAL
HYPERPLASIA, FOCAL APOCRINE METAPLASIA AND SCATTERED

FOCI OF INTRALUMINAL MICROCALCIFICATION. NEGATIVE FOR ADH, ALH, DCIS
AND INVASIVE CARCINOMA of the RIGHT breast, medial (barbell clip).
This was found to be concordant by Dr. MARINEO.

Pathology revealed FIBROCYSTIC CHANGES WITH USUAL EPITHELIAL
HYPERPLASIA AND SMALL FOCI OF INTRALUMINAL MICROCALCIFICATION AND
SCLEROSING ADENOSIS. NEGATIVE FOR ADH, ALH, DCIS AND INVASIVE
CARCINOMA of the LEFT breast, upper inner (cylinder clip). This was
found to be concordant by Dr. MARINEO.

Pathology revealed FIBROCYSTIC CHANGES WITH USUAL EPITHELIAL
HYPERPLASIA AND MINUTE FOCI OF INTRALUMINAL

MICROCALCIFICATION- NEGATIVE FOR ADH, ALH, DCIS AND INVASIVE
CARCINOMA of the LEFT breast, lower central (barbell clip). This was
found to be concordant by Dr. MARINEO.

Pathology results were discussed with the patient by telephone. The
patient reported doing well after the biopsies with tenderness at
the sites. Post biopsy instructions and care were reviewed and
questions were answered. The patient was encouraged to call The

Bilateral breast MRI recommended in 6 months per protocol.

Pathology results reported by MARINEO RN on [DATE].

*** End of Addendum ***
FINDINGS: I met with the patient, and we discussed the procedure of MRI guided
biopsy, including risks, benefits, and alternatives. Specifically,
we discussed the risks of infection, bleeding, tissue injury, clip
migration, and inadequate sampling. Informed, written consent was
given. The usual time out protocol was performed immediately prior
to the procedure.

1. Using sterile technique, 1% Lidocaine, MRI guidance, and a 9
gauge vacuum assisted device, biopsy was performed of non mass
enhancement in the medial right breast using a lateral approach. At
the conclusion of the procedure, a a barbell tissue marker clip was
deployed into the biopsy cavity. Follow-up 2-view mammogram was
performed and dictated separately.

2. Using sterile technique, 1% Lidocaine, MRI guidance, and a 9
gauge vacuum assisted device, biopsy was performed of non mass
enhancement in the upper inner left breast using a lateral approach.
At the conclusion of the procedure, a a cylinder tissue marker clip
was deployed into the biopsy cavity. Follow-up 2-view mammogram was
performed and dictated separately.

3. Using sterile technique, 1% Lidocaine, MRI guidance, and a 9
gauge vacuum assisted device, biopsy was performed of non mass
enhancement in the lower central left breast using a lateral
approach. At the conclusion of the procedure, a a barbell tissue
marker clip was deployed into the biopsy cavity. Follow-up 2-view
mammogram was performed and dictated separately.
IMPRESSION: MRI guided biopsy of non mass enhancement in the medial right
breast, of non mass enhancement in the upper inner left breast, and
of non mass enhancement in the lower central left breast. No
apparent complications.

## 2021-09-21 IMAGING — MG MM BREAST LOCALIZATION CLIP
4 series · 4 of 12 positions shown · non-contrast
Comparison: Previous exam(s).

CLINICAL DATA: Post procedure mammogram for clip placement

EXAM:
3D DIAGNOSTIC BILATERAL MAMMOGRAM POST MRI BIOPSY

[R CC synth-2D]
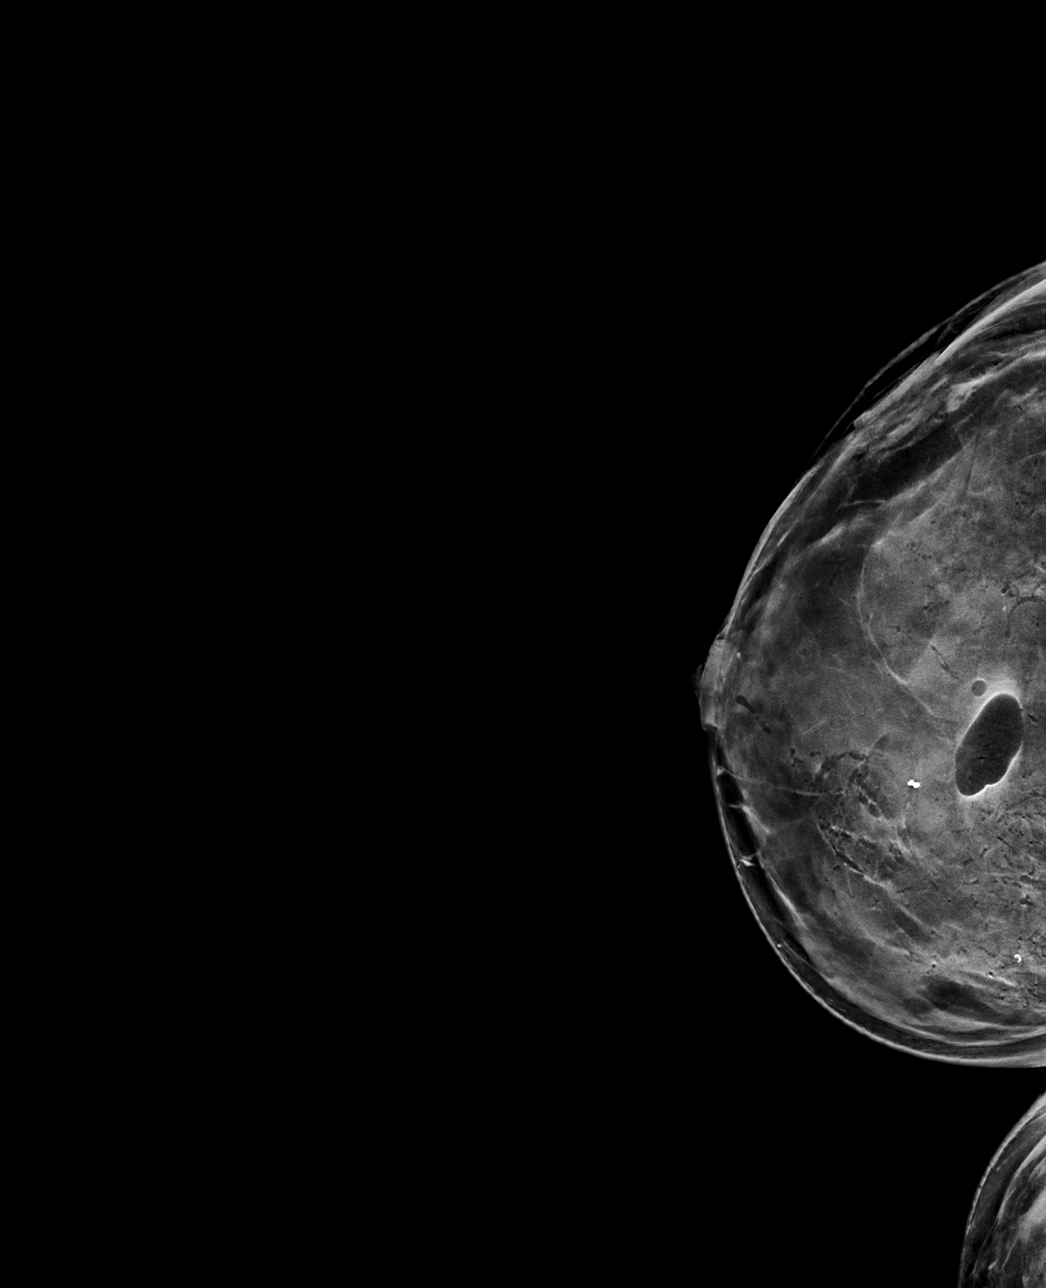

[R ML synth-2D]
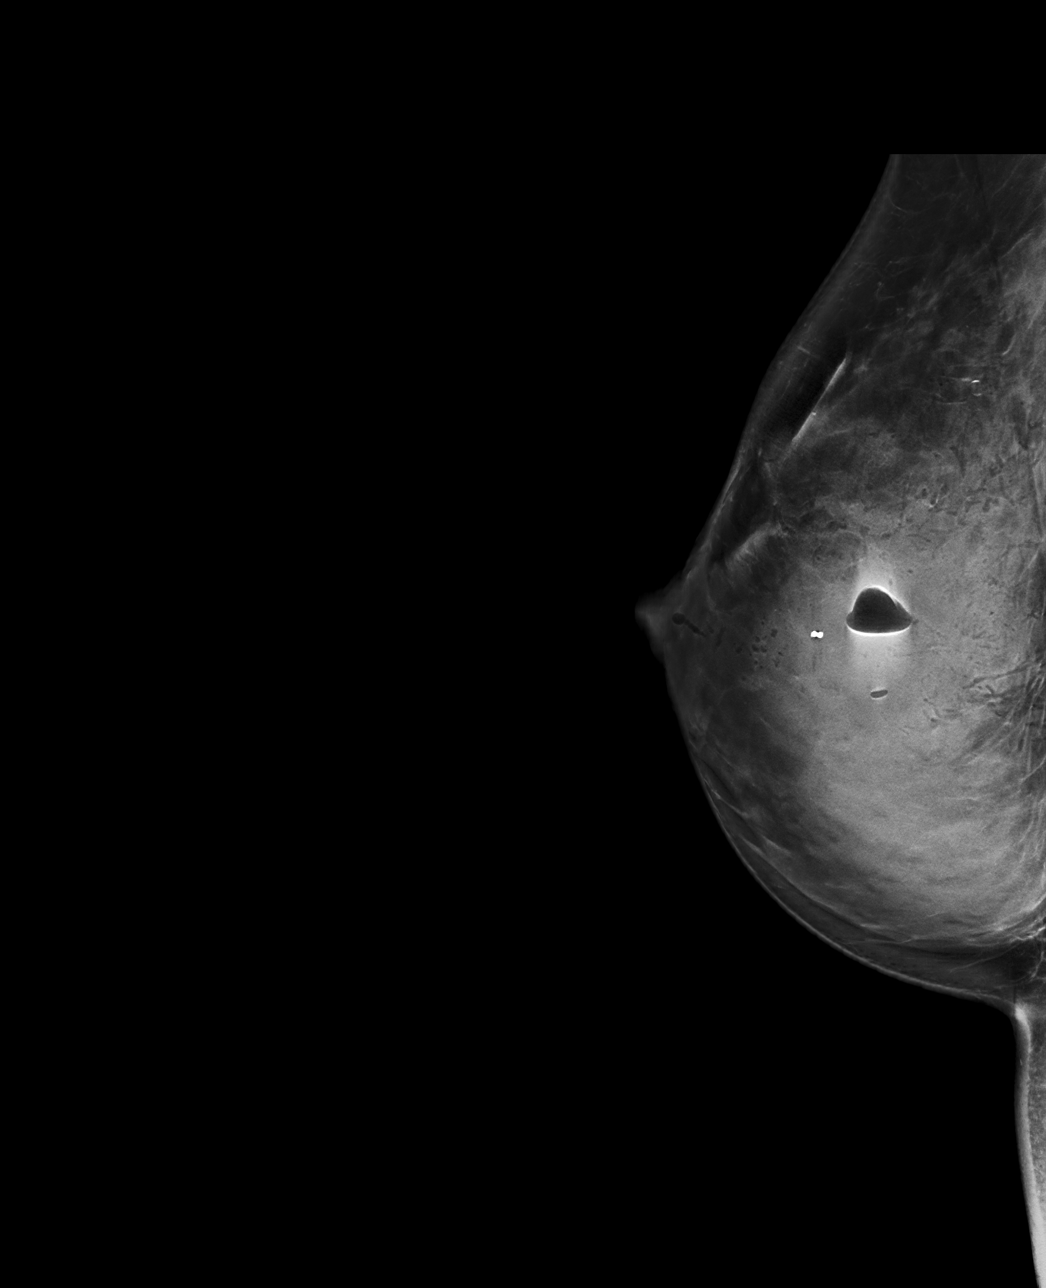

[R CC tomo · tomo slice 35/70.0]
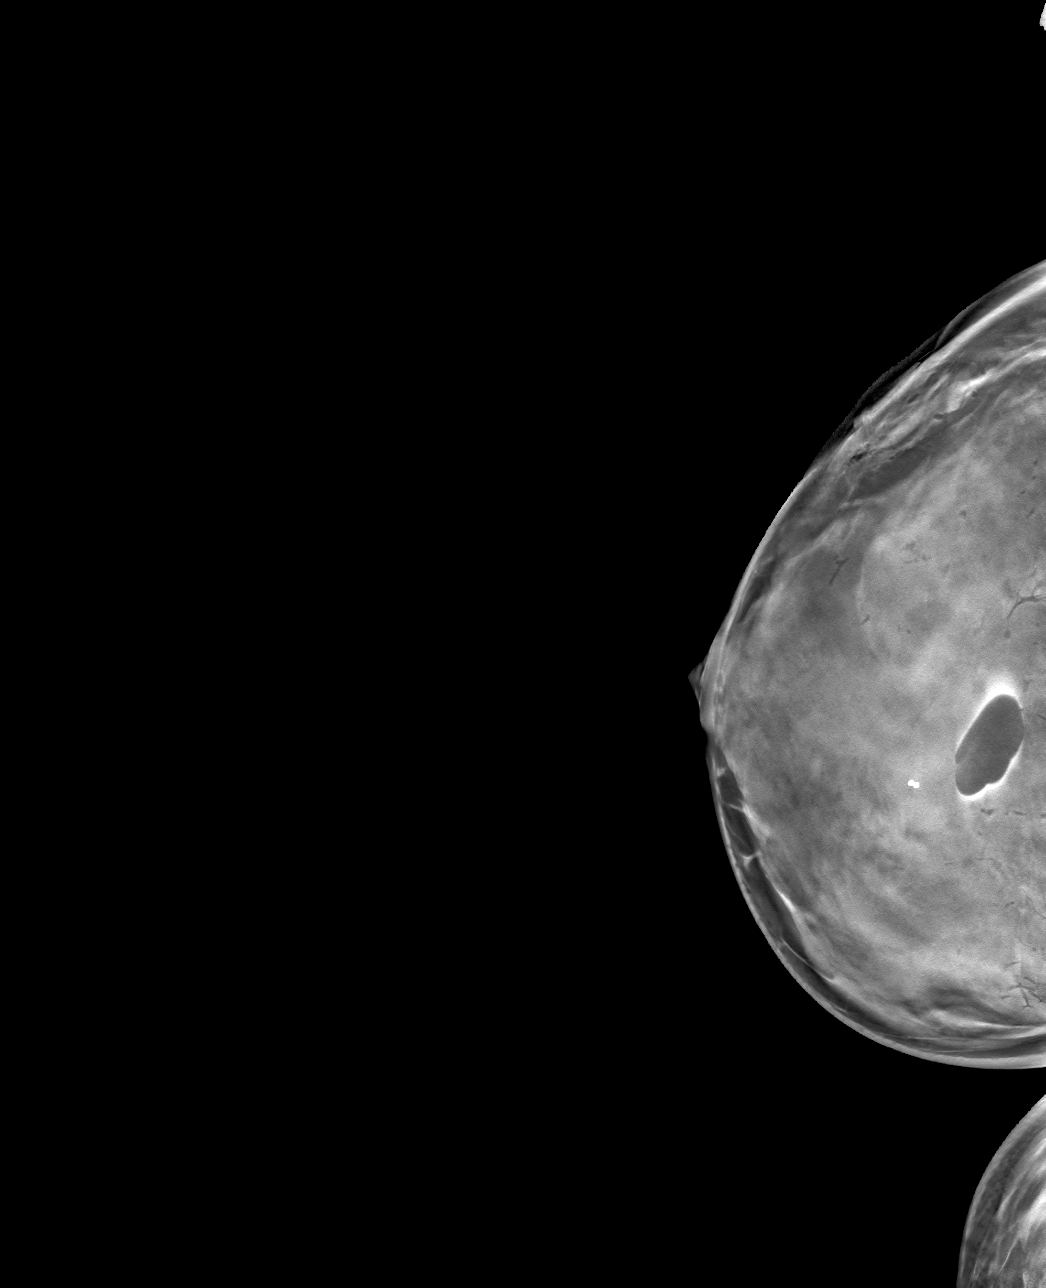

[R ML tomo · tomo slice 41/81.0]
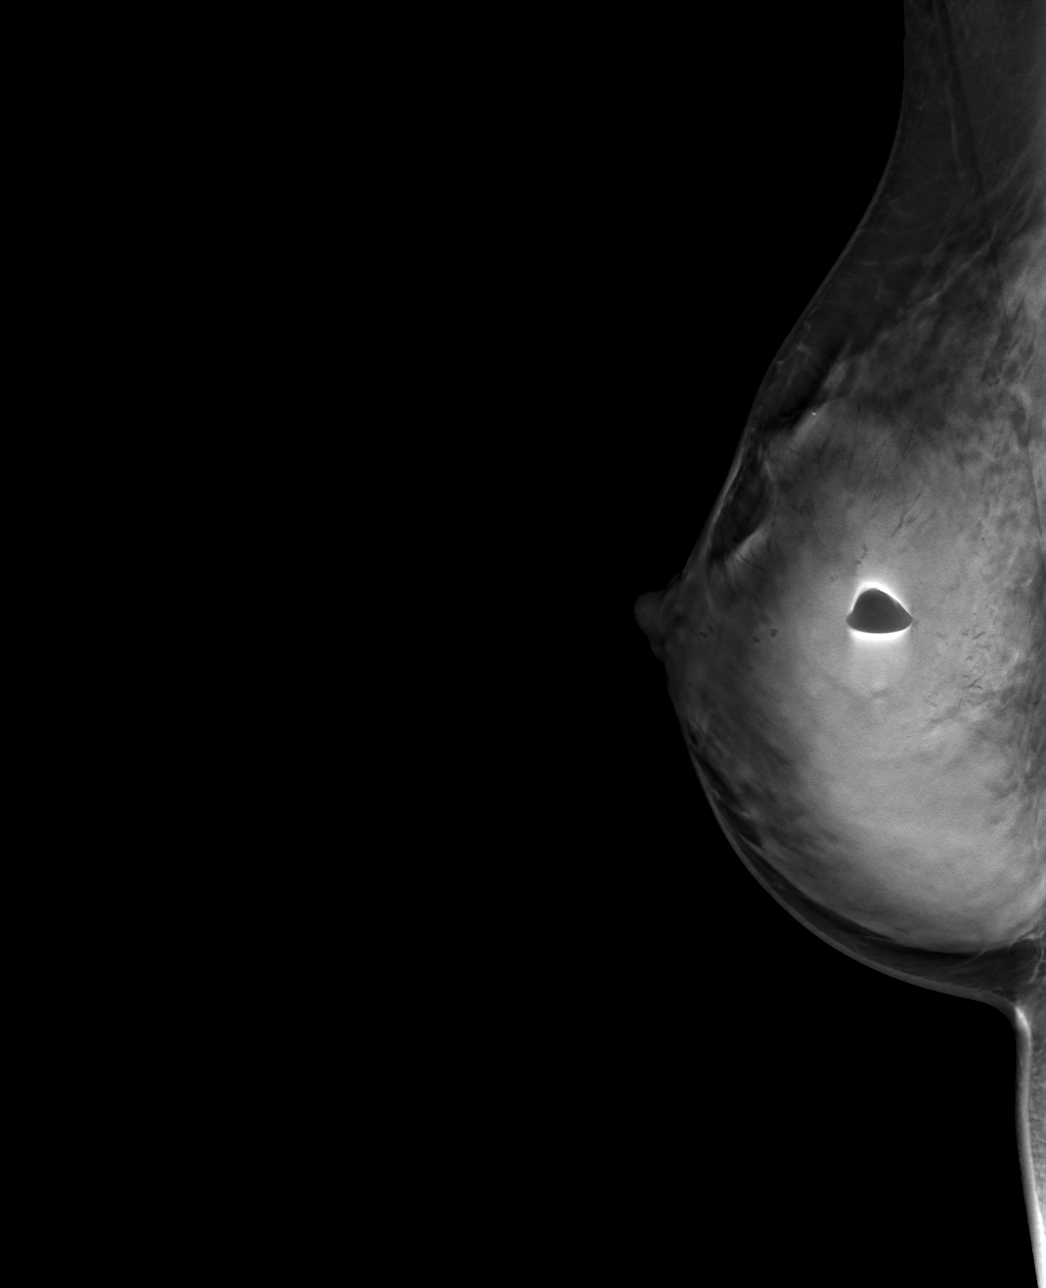

[4 of 12 positions shown; findings below may reference images not displayed]

FINDINGS: Right breast:

3D Mammographic images were obtained following MRI guided biopsy of
non mass enhancement in the medial right breast. The barbell biopsy
marking clip is in expected position at the site of biopsy.

Left breast:

3D Mammographic images were obtained following MRI guided biopsy of
non mass enhancement in the upper inner left breast. The cylinder
biopsy marking clip is in expected position at the site of biopsy.

3D Mammographic images were obtained following MRI guided biopsy of
non mass enhancement in the lower central left breast. The barbell
biopsy marking clip appears displaced laterally from the biopsy site
by approximately 3 cm.
IMPRESSION: Right breast:

Appropriate positioning of the barbell shaped biopsy marking clip at
the site of biopsy in the medial right breast.

Left breast:

Appropriate positioning of the cylinder shaped biopsy marking clip
at the site of biopsy in the upper inner left breast.

Displacement of the barbell shaped biopsy marking clip in the left
breast by approximately 3 cm laterally from the biopsy site in the
lower central left breast.

Final Assessment: Post Procedure Mammograms for Marker Placement

## 2021-09-21 MED ORDER — GADOBUTROL 1 MMOL/ML IV SOLN
10.0000 mL | Freq: Once | INTRAVENOUS | Status: AC | PRN
  Administered 2021-09-21: 10 mL via INTRAVENOUS

## 2022-02-02 DIAGNOSIS — H5213 Myopia, bilateral: Secondary | ICD-10-CM | POA: Diagnosis not present

## 2022-02-02 DIAGNOSIS — H04123 Dry eye syndrome of bilateral lacrimal glands: Secondary | ICD-10-CM | POA: Diagnosis not present

## 2022-04-25 DIAGNOSIS — Z6822 Body mass index (BMI) 22.0-22.9, adult: Secondary | ICD-10-CM | POA: Diagnosis not present

## 2022-04-25 DIAGNOSIS — Z124 Encounter for screening for malignant neoplasm of cervix: Secondary | ICD-10-CM | POA: Diagnosis not present

## 2022-04-25 DIAGNOSIS — Z01419 Encounter for gynecological examination (general) (routine) without abnormal findings: Secondary | ICD-10-CM | POA: Diagnosis not present

## 2022-04-25 DIAGNOSIS — R6882 Decreased libido: Secondary | ICD-10-CM | POA: Diagnosis not present

## 2022-07-23 DIAGNOSIS — J029 Acute pharyngitis, unspecified: Secondary | ICD-10-CM | POA: Diagnosis not present

## 2022-07-23 DIAGNOSIS — I10 Essential (primary) hypertension: Secondary | ICD-10-CM | POA: Diagnosis not present

## 2022-07-27 DIAGNOSIS — I10 Essential (primary) hypertension: Secondary | ICD-10-CM | POA: Diagnosis not present

## 2022-07-27 DIAGNOSIS — R7989 Other specified abnormal findings of blood chemistry: Secondary | ICD-10-CM | POA: Diagnosis not present

## 2022-08-03 DIAGNOSIS — R809 Proteinuria, unspecified: Secondary | ICD-10-CM | POA: Diagnosis not present

## 2022-08-03 DIAGNOSIS — Z0000 Encounter for general adult medical examination without abnormal findings: Secondary | ICD-10-CM | POA: Diagnosis not present

## 2022-08-03 DIAGNOSIS — R82998 Other abnormal findings in urine: Secondary | ICD-10-CM | POA: Diagnosis not present

## 2022-08-03 DIAGNOSIS — Z1331 Encounter for screening for depression: Secondary | ICD-10-CM | POA: Diagnosis not present

## 2022-08-03 DIAGNOSIS — I10 Essential (primary) hypertension: Secondary | ICD-10-CM | POA: Diagnosis not present

## 2022-08-03 DIAGNOSIS — Z1339 Encounter for screening examination for other mental health and behavioral disorders: Secondary | ICD-10-CM | POA: Diagnosis not present

## 2022-08-03 DIAGNOSIS — N6459 Other signs and symptoms in breast: Secondary | ICD-10-CM | POA: Diagnosis not present

## 2022-12-07 DIAGNOSIS — J020 Streptococcal pharyngitis: Secondary | ICD-10-CM | POA: Diagnosis not present

## 2022-12-07 DIAGNOSIS — J029 Acute pharyngitis, unspecified: Secondary | ICD-10-CM | POA: Diagnosis not present

## 2023-01-11 DIAGNOSIS — B351 Tinea unguium: Secondary | ICD-10-CM | POA: Diagnosis not present

## 2023-01-28 DIAGNOSIS — Z1239 Encounter for other screening for malignant neoplasm of breast: Secondary | ICD-10-CM | POA: Diagnosis not present

## 2023-01-28 DIAGNOSIS — Z9189 Other specified personal risk factors, not elsewhere classified: Secondary | ICD-10-CM | POA: Diagnosis not present

## 2023-02-04 DIAGNOSIS — B351 Tinea unguium: Secondary | ICD-10-CM | POA: Diagnosis not present

## 2023-02-08 DIAGNOSIS — Z9189 Other specified personal risk factors, not elsewhere classified: Secondary | ICD-10-CM | POA: Diagnosis not present

## 2023-02-22 DIAGNOSIS — N644 Mastodynia: Secondary | ICD-10-CM | POA: Diagnosis not present

## 2023-02-22 DIAGNOSIS — R92343 Mammographic extreme density, bilateral breasts: Secondary | ICD-10-CM | POA: Diagnosis not present

## 2023-02-22 DIAGNOSIS — Z1239 Encounter for other screening for malignant neoplasm of breast: Secondary | ICD-10-CM | POA: Diagnosis not present

## 2023-02-22 DIAGNOSIS — Z9189 Other specified personal risk factors, not elsewhere classified: Secondary | ICD-10-CM | POA: Diagnosis not present

## 2023-02-22 DIAGNOSIS — N6001 Solitary cyst of right breast: Secondary | ICD-10-CM | POA: Diagnosis not present

## 2023-02-22 DIAGNOSIS — N6002 Solitary cyst of left breast: Secondary | ICD-10-CM | POA: Diagnosis not present

## 2023-02-27 DIAGNOSIS — Z1239 Encounter for other screening for malignant neoplasm of breast: Secondary | ICD-10-CM | POA: Diagnosis not present

## 2023-03-06 DIAGNOSIS — Z9189 Other specified personal risk factors, not elsewhere classified: Secondary | ICD-10-CM | POA: Diagnosis not present

## 2023-03-08 DIAGNOSIS — H5213 Myopia, bilateral: Secondary | ICD-10-CM | POA: Diagnosis not present

## 2023-03-08 DIAGNOSIS — H04123 Dry eye syndrome of bilateral lacrimal glands: Secondary | ICD-10-CM | POA: Diagnosis not present

## 2023-03-20 DIAGNOSIS — Z9189 Other specified personal risk factors, not elsewhere classified: Secondary | ICD-10-CM | POA: Diagnosis not present

## 2023-04-26 DIAGNOSIS — Z9189 Other specified personal risk factors, not elsewhere classified: Secondary | ICD-10-CM | POA: Diagnosis not present

## 2023-05-14 DIAGNOSIS — Z421 Encounter for breast reconstruction following mastectomy: Secondary | ICD-10-CM | POA: Diagnosis not present

## 2023-05-14 DIAGNOSIS — N6012 Diffuse cystic mastopathy of left breast: Secondary | ICD-10-CM | POA: Diagnosis not present

## 2023-05-14 DIAGNOSIS — Z4001 Encounter for prophylactic removal of breast: Secondary | ICD-10-CM | POA: Diagnosis not present

## 2023-05-14 DIAGNOSIS — G8918 Other acute postprocedural pain: Secondary | ICD-10-CM | POA: Diagnosis not present

## 2023-05-14 DIAGNOSIS — N6011 Diffuse cystic mastopathy of right breast: Secondary | ICD-10-CM | POA: Diagnosis not present

## 2023-05-14 DIAGNOSIS — N62 Hypertrophy of breast: Secondary | ICD-10-CM | POA: Diagnosis not present

## 2023-05-14 DIAGNOSIS — R921 Mammographic calcification found on diagnostic imaging of breast: Secondary | ICD-10-CM | POA: Diagnosis not present

## 2023-05-14 DIAGNOSIS — R92343 Mammographic extreme density, bilateral breasts: Secondary | ICD-10-CM | POA: Diagnosis not present

## 2023-05-14 DIAGNOSIS — Z9189 Other specified personal risk factors, not elsewhere classified: Secondary | ICD-10-CM | POA: Diagnosis not present

## 2023-05-14 DIAGNOSIS — Z9013 Acquired absence of bilateral breasts and nipples: Secondary | ICD-10-CM | POA: Diagnosis not present

## 2023-05-14 DIAGNOSIS — N61 Mastitis without abscess: Secondary | ICD-10-CM | POA: Diagnosis not present

## 2023-05-14 DIAGNOSIS — Z803 Family history of malignant neoplasm of breast: Secondary | ICD-10-CM | POA: Diagnosis not present

## 2023-05-15 DIAGNOSIS — Z9013 Acquired absence of bilateral breasts and nipples: Secondary | ICD-10-CM | POA: Diagnosis not present

## 2023-05-15 DIAGNOSIS — R92343 Mammographic extreme density, bilateral breasts: Secondary | ICD-10-CM | POA: Diagnosis not present

## 2023-05-15 DIAGNOSIS — Z9189 Other specified personal risk factors, not elsewhere classified: Secondary | ICD-10-CM | POA: Diagnosis not present

## 2023-05-15 DIAGNOSIS — Z803 Family history of malignant neoplasm of breast: Secondary | ICD-10-CM | POA: Diagnosis not present

## 2023-05-15 DIAGNOSIS — Z4001 Encounter for prophylactic removal of breast: Secondary | ICD-10-CM | POA: Diagnosis not present

## 2023-05-22 DIAGNOSIS — Z9013 Acquired absence of bilateral breasts and nipples: Secondary | ICD-10-CM | POA: Diagnosis not present

## 2023-05-22 DIAGNOSIS — Z9189 Other specified personal risk factors, not elsewhere classified: Secondary | ICD-10-CM | POA: Diagnosis not present

## 2023-05-24 DIAGNOSIS — Z9189 Other specified personal risk factors, not elsewhere classified: Secondary | ICD-10-CM | POA: Diagnosis not present

## 2023-05-28 DIAGNOSIS — Z9189 Other specified personal risk factors, not elsewhere classified: Secondary | ICD-10-CM | POA: Diagnosis not present

## 2023-05-31 DIAGNOSIS — Z9189 Other specified personal risk factors, not elsewhere classified: Secondary | ICD-10-CM | POA: Diagnosis not present

## 2023-06-03 DIAGNOSIS — Z9189 Other specified personal risk factors, not elsewhere classified: Secondary | ICD-10-CM | POA: Diagnosis not present

## 2023-06-13 DIAGNOSIS — Z9189 Other specified personal risk factors, not elsewhere classified: Secondary | ICD-10-CM | POA: Diagnosis not present

## 2023-06-26 DIAGNOSIS — Z9189 Other specified personal risk factors, not elsewhere classified: Secondary | ICD-10-CM | POA: Diagnosis not present

## 2023-07-26 DIAGNOSIS — Z1212 Encounter for screening for malignant neoplasm of rectum: Secondary | ICD-10-CM | POA: Diagnosis not present

## 2023-07-26 DIAGNOSIS — Z1389 Encounter for screening for other disorder: Secondary | ICD-10-CM | POA: Diagnosis not present

## 2023-08-02 DIAGNOSIS — R7989 Other specified abnormal findings of blood chemistry: Secondary | ICD-10-CM | POA: Diagnosis not present

## 2023-08-09 DIAGNOSIS — I10 Essential (primary) hypertension: Secondary | ICD-10-CM | POA: Diagnosis not present

## 2023-08-09 DIAGNOSIS — Z1339 Encounter for screening examination for other mental health and behavioral disorders: Secondary | ICD-10-CM | POA: Diagnosis not present

## 2023-08-09 DIAGNOSIS — R82998 Other abnormal findings in urine: Secondary | ICD-10-CM | POA: Diagnosis not present

## 2023-08-09 DIAGNOSIS — Z0000 Encounter for general adult medical examination without abnormal findings: Secondary | ICD-10-CM | POA: Diagnosis not present

## 2023-08-09 DIAGNOSIS — Z9189 Other specified personal risk factors, not elsewhere classified: Secondary | ICD-10-CM | POA: Diagnosis not present

## 2023-08-09 DIAGNOSIS — Z1331 Encounter for screening for depression: Secondary | ICD-10-CM | POA: Diagnosis not present

## 2023-09-20 DIAGNOSIS — Z9189 Other specified personal risk factors, not elsewhere classified: Secondary | ICD-10-CM | POA: Diagnosis not present

## 2023-10-14 DIAGNOSIS — Z124 Encounter for screening for malignant neoplasm of cervix: Secondary | ICD-10-CM | POA: Diagnosis not present

## 2023-10-14 DIAGNOSIS — Z6823 Body mass index (BMI) 23.0-23.9, adult: Secondary | ICD-10-CM | POA: Diagnosis not present

## 2023-10-14 DIAGNOSIS — Z01419 Encounter for gynecological examination (general) (routine) without abnormal findings: Secondary | ICD-10-CM | POA: Diagnosis not present

## 2024-02-07 DIAGNOSIS — Z9189 Other specified personal risk factors, not elsewhere classified: Secondary | ICD-10-CM | POA: Diagnosis not present

## 2024-03-11 DIAGNOSIS — I10 Essential (primary) hypertension: Secondary | ICD-10-CM | POA: Diagnosis not present
